# Patient Record
Sex: Male | Born: 1970 | Race: Black or African American | Hispanic: No | Marital: Single | Smoking: Current every day smoker
Health system: Southern US, Community
[De-identification: ages and names within clinical notes are randomized; demographics above are authoritative.]

## PROBLEM LIST (undated history)

## (undated) ENCOUNTER — Emergency Department: Discharge status: Absent without leave

## (undated) HISTORY — PX: LEG SURGERY: SHX1003

---

## 2005-11-12 ENCOUNTER — Emergency Department: Payer: Self-pay | Admitting: Emergency Medicine

## 2005-12-03 ENCOUNTER — Emergency Department: Payer: Self-pay | Admitting: Emergency Medicine

## 2011-02-17 ENCOUNTER — Emergency Department: Payer: Self-pay | Admitting: Emergency Medicine

## 2012-02-12 ENCOUNTER — Emergency Department: Payer: Self-pay | Admitting: Emergency Medicine

## 2012-02-12 LAB — COMPREHENSIVE METABOLIC PANEL
Albumin: 3.5 g/dL (ref 3.4–5.0)
Anion Gap: 11 (ref 7–16)
Bilirubin,Total: 0.3 mg/dL (ref 0.2–1.0)
Calcium, Total: 7.9 mg/dL — ABNORMAL LOW (ref 8.5–10.1)
Creatinine: 1.17 mg/dL (ref 0.60–1.30)
Osmolality: 283 (ref 275–301)
Potassium: 3.1 mmol/L — ABNORMAL LOW (ref 3.5–5.1)
SGOT(AST): 36 U/L (ref 15–37)
Sodium: 142 mmol/L (ref 136–145)
Total Protein: 7.1 g/dL (ref 6.4–8.2)

## 2012-02-12 LAB — DRUG SCREEN, URINE
Barbiturates, Ur Screen: NEGATIVE (ref ?–200)
Benzodiazepine, Ur Scrn: NEGATIVE (ref ?–200)
Cannabinoid 50 Ng, Ur ~~LOC~~: POSITIVE (ref ?–50)
MDMA (Ecstasy)Ur Screen: NEGATIVE (ref ?–500)
Opiate, Ur Screen: NEGATIVE (ref ?–300)
Phencyclidine (PCP) Ur S: NEGATIVE (ref ?–25)
Tricyclic, Ur Screen: NEGATIVE (ref ?–1000)

## 2012-02-12 LAB — ETHANOL: Ethanol: 213 mg/dL

## 2012-02-12 LAB — URINALYSIS, COMPLETE
Blood: NEGATIVE
Glucose,UR: NEGATIVE mg/dL (ref 0–75)
Leukocyte Esterase: NEGATIVE
Protein: NEGATIVE
Squamous Epithelial: NONE SEEN

## 2012-02-12 LAB — TROPONIN I: Troponin-I: <0.02 ng/mL

## 2012-02-12 LAB — CBC
MCH: 30.1 pg (ref 26.0–34.0)
WBC: 4.3 10*3/uL (ref 3.8–10.6)

## 2012-02-13 LAB — ETHANOL: Ethanol: 75 mg/dL

## 2012-05-24 ENCOUNTER — Emergency Department: Payer: Self-pay | Admitting: Emergency Medicine

## 2012-07-11 ENCOUNTER — Emergency Department: Payer: Self-pay | Admitting: Internal Medicine

## 2013-08-19 ENCOUNTER — Emergency Department: Payer: Self-pay | Admitting: Emergency Medicine

## 2013-09-17 ENCOUNTER — Emergency Department: Payer: Self-pay | Admitting: Emergency Medicine

## 2013-10-05 ENCOUNTER — Emergency Department: Payer: Self-pay | Admitting: Emergency Medicine

## 2013-10-05 LAB — RAPID INFLUENZA A&B ANTIGENS

## 2015-02-01 ENCOUNTER — Emergency Department
Admit: 2015-02-01 | Disposition: A | Discharge status: Home / Self Care | Payer: Self-pay | Admitting: Emergency Medicine

## 2016-03-14 ENCOUNTER — Encounter: Payer: Self-pay | Admitting: Emergency Medicine

## 2016-03-14 ENCOUNTER — Emergency Department
Admission: EM | Admit: 2016-03-14 | Discharge: 2016-03-14 | Disposition: A | Discharge status: Home / Self Care | Payer: Self-pay | Attending: Emergency Medicine | Admitting: Emergency Medicine

## 2016-03-14 DIAGNOSIS — W260XXA Contact with knife, initial encounter: Secondary | ICD-10-CM | POA: Insufficient documentation

## 2016-03-14 DIAGNOSIS — Y9389 Activity, other specified: Secondary | ICD-10-CM | POA: Insufficient documentation

## 2016-03-14 DIAGNOSIS — Y9289 Other specified places as the place of occurrence of the external cause: Secondary | ICD-10-CM | POA: Insufficient documentation

## 2016-03-14 DIAGNOSIS — F17210 Nicotine dependence, cigarettes, uncomplicated: Secondary | ICD-10-CM | POA: Insufficient documentation

## 2016-03-14 DIAGNOSIS — Y999 Unspecified external cause status: Secondary | ICD-10-CM | POA: Insufficient documentation

## 2016-03-14 DIAGNOSIS — S61213A Laceration without foreign body of left middle finger without damage to nail, initial encounter: Secondary | ICD-10-CM | POA: Insufficient documentation

## 2016-03-14 DIAGNOSIS — S61215A Laceration without foreign body of left ring finger without damage to nail, initial encounter: Secondary | ICD-10-CM | POA: Insufficient documentation

## 2016-03-14 DIAGNOSIS — S61412A Laceration without foreign body of left hand, initial encounter: Secondary | ICD-10-CM
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
## 2016-03-14 NOTE — Discharge Instructions (Signed)
Laceration Care, Adult A laceration is a cut that goes through all of the layers of the skin and into the tissue that is right under the skin. Some lacerations heal on their own. Others need to be closed with stitches (sutures), staples, skin adhesive strips, or skin glue. Proper laceration care minimizes the risk of infection and helps the laceration to heal better. HOW TO CARE FOR YOUR LACERATION If sutures or staples were used:  Keep the wound clean and dry.  If you were given a bandage (dressing), you should change it at least one time per day or as told by your health care provider. You should also change it if it becomes wet or dirty.  Keep the wound completely dry for the first 24 hours or as told by your health care provider. After that time, you may shower or bathe. However, make sure that the wound is not soaked in water until after the sutures or staples have been removed.  Clean the wound one time each day or as told by your health care provider:  Wash the wound with soap and water.  Rinse the wound with water to remove all soap.  Pat the wound dry with a clean towel. Do not rub the wound.  After cleaning the wound, apply a thin layer of antibiotic ointmentas told by your health care provider. This will help to prevent infection and keep the dressing from sticking to the wound.  Have the sutures or staples removed as told by your health care provider. If skin adhesive strips were used:  Keep the wound clean and dry.  If you were given a bandage (dressing), you should change it at least one time per day or as told by your health care provider. You should also change it if it becomes dirty or wet.  Do not get the skin adhesive strips wet. You may shower or bathe, but be careful to keep the wound dry.  If the wound gets wet, pat it dry with a clean towel. Do not rub the wound.  Skin adhesive strips fall off on their own. You may trim the strips as the wound heals. Do not  remove skin adhesive strips that are still stuck to the wound. They will fall off in time. If skin glue was used:  Try to keep the wound dry, but you may briefly wet it in the shower or bath. Do not soak the wound in water, such as by swimming.  After you have showered or bathed, gently pat the wound dry with a clean towel. Do not rub the wound.  Do not do any activities that will make you sweat heavily until the skin glue has fallen off on its own.  Do not apply liquid, cream, or ointment medicine to the wound while the skin glue is in place. Using those may loosen the film before the wound has healed.  If you were given a bandage (dressing), you should change it at least one time per day or as told by your health care provider. You should also change it if it becomes dirty or wet.  If a dressing is placed over the wound, be careful not to apply tape directly over the skin glue. Doing that may cause the glue to be pulled off before the wound has healed.  Do not pick at the glue. The skin glue usually remains in place for 5-10 days, then it falls off of the skin. General Instructions  Take over-the-counter and prescription  medicines only as told by your health care provider.  If you were prescribed an antibiotic medicine or ointment, take or apply it as told by your doctor. Do not stop using it even if your condition improves.  To help prevent scarring, make sure to cover your wound with sunscreen whenever you are outside after stitches are removed, after adhesive strips are removed, or when glue remains in place and the wound is healed. Make sure to wear a sunscreen of at least 30 SPF.  Do not scratch or pick at the wound.  Keep all follow-up visits as told by your health care provider. This is important.  Check your wound every day for signs of infection. Watch for:  Redness, swelling, or pain.  Fluid, blood, or pus.  Raise (elevate) the injured area above the level of your heart  while you are sitting or lying down, if possible. SEEK MEDICAL CARE IF:  You received a tetanus shot and you have swelling, severe pain, redness, or bleeding at the injection site.  You have a fever.  A wound that was closed breaks open.  You notice a bad smell coming from your wound or your dressing.  You notice something coming out of the wound, such as wood or glass.  Your pain is not controlled with medicine.  You have increased redness, swelling, or pain at the site of your wound.  You have fluid, blood, or pus coming from your wound.  You notice a change in the color of your skin near your wound.  You need to change the dressing frequently due to fluid, blood, or pus draining from the wound.  You develop a new rash.  You develop numbness around the wound. SEEK IMMEDIATE MEDICAL CARE IF:  You develop severe swelling around the wound.  Your pain suddenly increases and is severe.  You develop painful lumps near the wound or on skin that is anywhere on your body.  You have a red streak going away from your wound.  The wound is on your hand or foot and you cannot properly move a finger or toe.  The wound is on your hand or foot and you notice that your fingers or toes look pale or bluish.   This information is not intended to replace advice given to you by your health care provider. Make sure you discuss any questions you have with your health care provider.   Document Released: 09/24/2005 Document Revised: 02/08/2015 Document Reviewed: 09/20/2014 Elsevier Interactive Patient Education 2016 [employer]  Nonsutured Laceration Care A laceration is a cut that goes through all layers of the skin and extends into the tissue that is right under the skin. This type of cut is usually stitched up (sutured) or closed with tape (adhesive strips) or skin glue shortly after the injury happens. However, if the wound is dirty or if several hours pass before medical treatment is  provided, it is likely that germs (bacteria) will enter the wound. Closing a laceration after bacteria have entered it increases the risk of infection. In these cases, your health care provider may leave the laceration open (nonsutured) and cover it with a bandage. This type of treatment helps prevent infection and allows the wound to heal from the deepest layer of tissue damage up to the surface. An open fracture is a type of injury that may involve nonsutured lacerations. An open fracture is a break in a bone that happens along with one or more lacerations through the skin that  is near the fracture site. HOW TO CARE FOR YOUR NONSUTURED LACERATION  Take or apply over-the-counter and prescription medicines only as told by your health care provider.  If you were prescribed an antibiotic medicine, take or apply it as told by your health care provider. Do not stop using the antibiotic even if your condition improves.  Clean the wound one time each day or as told by your health care provider.  Wash the wound with mild soap and water.  Rinse the wound with water to remove all soap.  Pat your wound dry with a clean towel. Do not rub the wound.  Do not inject anything into the wound unless your health care provider told you to.  Change any bandages (dressings) as told by your health care provider. This includes changing the dressing if it gets wet, dirty, or starts to smell bad.  Keep the dressing dry until your health care provider says it can be removed. Do not take baths, swim, or do anything that puts your wound underwater until your health care provider approves.  Raise (elevate) the injured area above the level of your heart while you are sitting or lying down, if possible.  Do not scratch or pick at the wound.  Check your wound every day for signs of infection. Watch for:  Redness, swelling, or pain.  Fluid, blood, or pus.  Keep all follow-up visits as told by your health care  provider. This is important. SEEK MEDICAL CARE IF:  You received a tetanus and shot and you have swelling, severe pain, redness, or bleeding at the injection site.   You have a fever.  Your pain is not controlled with medicine.  You have increased redness, swelling, or pain at the site of your wound.  You have fluid, blood, or pus coming from your wound.  You notice a bad smell coming from your wound or your dressing.  You notice something coming out of the wound, such as wood or glass.  You notice a change in the color of your skin near your wound.  You develop a new rash.  You need to change the dressing frequently due to fluid, blood, or pus draining from the wound.  You develop numbness around your wound. SEEK IMMEDIATE MEDICAL CARE IF:  Your pain suddenly increases and is severe.  You develop severe swelling around the wound.  The wound is on your hand or foot and you cannot properly move a finger or toe.  The wound is on your hand or foot and you notice that your fingers or toes look pale or bluish.  You have a red streak going away from your wound.   This information is not intended to replace advice given to you by your health care provider. Make sure you discuss any questions you have with your health care provider.   Document Released: 08/22/2006 Document Revised: 02/08/2015 Document Reviewed: 09/20/2014 Elsevier Interactive Patient Education [employer].
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
## 2017-04-02 ENCOUNTER — Other Ambulatory Visit
Admission: RE | Admit: 2017-04-02 | Discharge: 2017-04-02 | Disposition: A | Discharge status: Home / Self Care | Attending: Family Medicine | Admitting: Family Medicine

## 2017-05-03 ENCOUNTER — Other Ambulatory Visit
Admission: RE | Admit: 2017-05-03 | Discharge: 2017-05-03 | Disposition: A | Discharge status: Home / Self Care | Attending: Family Medicine | Admitting: Family Medicine
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
## 2017-10-19 ENCOUNTER — Emergency Department: Payer: Self-pay

## 2017-10-19 ENCOUNTER — Emergency Department
Admission: EM | Admit: 2017-10-19 | Discharge: 2017-10-20 | Disposition: A | Discharge status: Home / Self Care | Payer: Self-pay | Attending: Emergency Medicine | Admitting: Emergency Medicine

## 2017-10-19 ENCOUNTER — Encounter: Payer: Self-pay | Admitting: Emergency Medicine

## 2017-10-19 DIAGNOSIS — M542 Cervicalgia: Secondary | ICD-10-CM | POA: Insufficient documentation

## 2017-10-19 DIAGNOSIS — Y939 Activity, unspecified: Secondary | ICD-10-CM | POA: Insufficient documentation

## 2017-10-19 DIAGNOSIS — Y999 Unspecified external cause status: Secondary | ICD-10-CM | POA: Insufficient documentation

## 2017-10-19 DIAGNOSIS — F17210 Nicotine dependence, cigarettes, uncomplicated: Secondary | ICD-10-CM | POA: Insufficient documentation

## 2017-10-19 DIAGNOSIS — F10929 Alcohol use, unspecified with intoxication, unspecified: Secondary | ICD-10-CM | POA: Insufficient documentation

## 2017-10-19 DIAGNOSIS — S0990XA Unspecified injury of head, initial encounter: Secondary | ICD-10-CM | POA: Insufficient documentation

## 2017-10-19 DIAGNOSIS — F1010 Alcohol abuse, uncomplicated: Secondary | ICD-10-CM

## 2017-10-19 DIAGNOSIS — W19XXXA Unspecified fall, initial encounter: Secondary | ICD-10-CM | POA: Insufficient documentation

## 2017-10-19 DIAGNOSIS — Y9289 Other specified places as the place of occurrence of the external cause: Secondary | ICD-10-CM | POA: Insufficient documentation

## 2017-10-19 MED ORDER — HYDROCODONE-ACETAMINOPHEN 5-325 MG PO TABS: 1.0000 | ORAL_TABLET | Freq: Once | ORAL | Status: DC

## 2017-10-19 MED ORDER — IBUPROFEN 600 MG PO TABS
600.0000 mg | ORAL_TABLET | Freq: Once | ORAL | Status: AC
  Administered 2017-10-19: 600 mg via ORAL
  Filled 2017-10-19: qty 1

## 2017-10-19 NOTE — ED Notes (Signed)
Dr. Rifenbark at bedside 

## 2017-10-19 NOTE — ED Provider Notes (Signed)
[facility] Emergency Department Provider Note  ____________________________________________   First MD Initiated Contact with Patient 10/19/17 2257     (approximate)  I have reviewed the triage vital signs and the nursing notes.   HISTORY  Chief Complaint Fall; Head Injury; Neck Pain; and Hearing Problem  Level 5 exemption history limited by the patient's alcohol intoxication  HPI Brian Mcconnell is a 47 y.o. male who was brought to the emergency department by his sister with moderate severity throbbing left neck pain that occurred after a fall at home after drinking alcohol.  The patient has a reported history of frequent falls when he has been drinking.  He does say that he has a throbbing aching discomfort in his left lateral neck.  He denies headache.  He denies double vision or blurred vision.  History reviewed. No pertinent past medical history.  There are no active problems to display for this patient.   Past Surgical History:  Procedure Laterality Date  . LEG SURGERY     bilaterl    Prior to Admission medications   Not on File    Allergies Bee venom  No family history on file.  Social History Social History   Tobacco Use  . Smoking status: Current Every Day Smoker    Packs/day: 1.00    Types: Cigarettes  . Smokeless tobacco: Never Used  Substance Use Topics  . Alcohol use: Yes    Comment: daily  . Drug use: Yes    Types: Cocaine, Marijuana    Review of Systems Level 5 exemption history limited by the patient's alcohol intoxication____________________________________________   PHYSICAL EXAM:  VITAL SIGNS: ED Triage Vitals  Enc Vitals Group     BP 10/19/17 2031 120/71     Pulse Rate 10/19/17 2031 96     Resp 10/19/17 2031 18     Temp 10/19/17 2031 97.9 F (36.6 C)     Temp Source 10/19/17 2031 Oral     SpO2 10/19/17 2031 97 %     Weight 10/19/17 2031 125 lb (56.7 kg)     Height 10/19/17 2031 5\' 3"  (1.6 m)     Head  Circumference --      Peak Flow --      Pain Score 10/19/17 2030 8     Pain Loc --      Pain Edu? --      Excl. in [place]? --     Constitutional: Clearly intoxicated but well-appearing.  No obvious signs of trauma Eyes: PERRL EOMI. Head: Atraumatic.  Normal tympanic membrane on the right mild hemotympanum on the left Nose: No congestion/rhinnorhea. Mouth/Throat: No trismus Neck: No stridor.   Cardiovascular: Normal rate, regular rhythm. Grossly normal heart sounds.  Good peripheral circulation. Respiratory: Normal respiratory effort.  No retractions. Lungs CTAB and moving good air Gastrointestinal: Soft nontender Musculoskeletal: No lower extremity edema   Neurologic:  Normal speech and language. No gross focal neurologic deficits are appreciated. Skin:  Skin is warm, dry and intact. No rash noted. Psychiatric: Mood and affect are normal. Speech and behavior are normal.    ____________________________________________   DIFFERENTIAL includes but not limited to  Intracerebral hemorrhage, cervical spine fracture, basilar skull fracture, intoxication ____________________________________________   LABS (all labs ordered are listed, but only abnormal results are displayed)  Labs Reviewed - No data to display   __________________________________________  EKG   ____________________________________________  RADIOLOGY  CT scan head neck and temporal bone with no acute disease.  I  do appreciate age-indeterminate osteophyte fracture ____________________________________________   PROCEDURES  Procedure(s) performed: no  Procedures  Critical Care performed: no  Observation: no ____________________________________________   INITIAL IMPRESSION / ASSESSMENT AND PLAN / ED COURSE  Pertinent labs & imaging results that were available during my care of the patient were reviewed by me and considered in my medical decision making (see chart for details).  The patient is  behaving appropriately with a normal neuro exam.  CT of his head is negative for acute pathology and CT cervical spine with an age-indeterminate fracture of an osteophyte but no evidence of cord injury.  He does have some slight hemotympanum on the left which is concerning for possible basilar skull fracture.  CT temporal bone is pending.     Fortunately the patient's CT temporal bone is negative for acute pathology.  He does have a ride home.  I have encouraged the patient to stop drinking alcohol and I will provide him resources as an outpatient.  He is discharged home in improved condition verbalizes understanding and agree with plan. ____________________________________________   FINAL CLINICAL IMPRESSION(S) / ED DIAGNOSES  Final diagnoses:  Injury of head, initial encounter  Neck pain  Alcoholic intoxication with complication ([place])  Alcohol abuse      NEW MEDICATIONS STARTED DURING THIS VISIT:  There are no discharge medications for this patient.    Note:  This document was prepared using Dragon voice recognition software and may include unintentional dictation errors.     Darel Hong, MD 10/20/17 [phone]
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
## 2018-07-14 ENCOUNTER — Emergency Department: Payer: Self-pay

## 2018-07-14 ENCOUNTER — Emergency Department
Admission: EM | Admit: 2018-07-14 | Discharge: 2018-07-14 | Disposition: A | Discharge status: Home / Self Care | Payer: Self-pay | Attending: Emergency Medicine | Admitting: Emergency Medicine

## 2018-07-14 ENCOUNTER — Other Ambulatory Visit: Payer: Self-pay

## 2018-07-14 ENCOUNTER — Encounter: Payer: Self-pay | Admitting: *Deleted

## 2018-07-14 DIAGNOSIS — G5631 Lesion of radial nerve, right upper limb: Secondary | ICD-10-CM | POA: Insufficient documentation

## 2018-07-14 DIAGNOSIS — R600 Localized edema: Secondary | ICD-10-CM

## 2018-07-14 DIAGNOSIS — F17210 Nicotine dependence, cigarettes, uncomplicated: Secondary | ICD-10-CM | POA: Insufficient documentation

## 2018-07-14 MED ORDER — MELOXICAM 15 MG PO TABS: 15.0000 mg | ORAL_TABLET | Freq: Every day | ORAL | 0 refills | Status: DC

## 2018-07-14 NOTE — ED Notes (Signed)
Also noted a bump with some blood behind Left ear which occurred at the same time

## 2018-07-14 NOTE — ED Notes (Signed)
Woke Sunday morning with R arm swollen from elbow down. The swelling has gone down some now and pt has been using advil and icy hot. Pt questions if it was maybe an insect bite on Sat night but doesn't recall a specific event or injury. States most painful in inner forearm area

## 2018-07-14 NOTE — ED Triage Notes (Signed)
Pt reports repetitious movement at work with right arm.  Pt has pain and swelling.  No known injury to arm.  Pt alert

## 2018-07-14 NOTE — ED Provider Notes (Signed)
[facility] Emergency Department Provider Note  ____________________________________________  Time seen: Approximately 8:50 PM  I have reviewed the triage vital signs and the nursing notes.   HISTORY  Chief Complaint Arm Injury    HPI Brian Mcconnell is a 47 y.o. male who presents the emergency department complaining of right forearm pain, swelling.  Patient reports that 3 days prior, he fell asleep on his right arm.  Patient reports that he woke up, it was "swollen" painful, has had limited range of motion to the right hand.  Patient reports that when he initially woke up, he was unable to extend or flex the wrist or any digit in his right hand.  Patient reports that he is gradually had return of movement to the wrist and all digits.  Patient initially reported complete numbness, the numbness of the third, fourth, fifth digits, now reports that he has sensation return.  Patient reports continued pain and swelling to the forearm however.  No ecchymosis to the region.  Patient denies any trauma to the area.  He denies any warmth to palpation.  No recent travel or surgeries.  No history of previous DVT, PE or clots.  No bleeding or clotting disorders.  No medications for his complaint prior to arrival.  No other complaints at this time.  Patient denies headache, visual changes, neck pain or stiffness, chest pain, shortness of breath, palpitations, other extremity pain or swelling.    No past medical history on file.  There are no active problems to display for this patient.   Past Surgical History:  Procedure Laterality Date  . LEG SURGERY     bilaterl    Prior to Admission medications   Medication Sig Start Date End Date Taking? Authorizing Provider  meloxicam (MOBIC) 15 MG tablet Take 1 tablet (15 mg total) by mouth daily. 07/14/18   Natali Lavallee, Charline Bills, PA-C    Allergies Bee venom  No family history on file.  Social History Social History   Tobacco Use   . Smoking status: Current Every Day Smoker    Packs/day: 1.00    Types: Cigarettes  . Smokeless tobacco: Never Used  Substance Use Topics  . Alcohol use: Yes    Comment: daily  . Drug use: Yes    Types: Cocaine, Marijuana     Review of Systems  Constitutional: No fever/chills Eyes: No visual changes.  Cardiovascular: no chest pain. Respiratory: no cough. No SOB. Gastrointestinal: No abdominal pain.  No nausea, no vomiting. Musculoskeletal: Positive for pain, swelling to the right forearm. limited range of motion to the wrist and right hand.  Symptoms are improving. Skin: Negative for rash, abrasions, lacerations, ecchymosis. Neurological: Negative for headaches, focal weakness or numbness. 10-point ROS otherwise negative.  ____________________________________________   PHYSICAL EXAM:  VITAL SIGNS: ED Triage Vitals  Enc Vitals Group     BP 07/14/18 1950 109/74     Pulse Rate 07/14/18 1950 80     Resp 07/14/18 1950 18     Temp 07/14/18 1950 99 F (37.2 C)     Temp Source 07/14/18 1950 Oral     SpO2 07/14/18 1950 98 %     Weight 07/14/18 1952 135 lb (61.2 kg)     Height 07/14/18 1952 5\' 2"  (1.575 m)     Head Circumference --      Peak Flow --      Pain Score 07/14/18 1951 0     Pain Loc --  Pain Edu? --      Excl. in [place]? --      Constitutional: Alert and oriented. Well appearing and in no acute distress. Eyes: Conjunctivae are normal. PERRL. EOMI. Head: Atraumatic. Neck: No stridor.  No cervical spine tenderness to palpation.  Cardiovascular: Normal rate, regular rhythm. Normal S1 and S2.  Good peripheral circulation. Respiratory: Normal respiratory effort without tachypnea or retractions. Lungs CTAB. Good air entry to the bases with no decreased or absent breath sounds. Musculoskeletal: Full range of motion to all extremities. No gross deformities appreciated.  Visualization of the right upper extremity reveals moderate edema of the forearm when compared  with unaffected.  No visible erythema.  No visible cellulitic changes.  No visible wounds.  No ecchymosis.  No deformity.  Palpation of the right forearm reveals moderate tenderness with no palpable abnormality.  Compartments are firm but are not hard.  Patient is able to flex the wrist, all digits of the hands appropriately.  No significant pain with flexion or extension of the wrist distal to edema.  Radial pulse intact.  Sensation intact x5 digits.  Capillary refill less than 2 seconds all digits. Neurologic:  Normal speech and language. No gross focal neurologic deficits are appreciated.  Skin:  Skin is warm, dry and intact. No rash noted. Psychiatric: Mood and affect are normal. Speech and behavior are normal. Patient exhibits appropriate insight and judgement.   ____________________________________________   LABS (all labs ordered are listed, but only abnormal results are displayed)  Labs Reviewed - No data to display ____________________________________________  EKG   ____________________________________________  RADIOLOGY I personally viewed and evaluated written report by the radiologist.  US Venous Img Upper Uni Right  Result Date: 07/14/2018 CLINICAL DATA:  Pain and swelling to the right lower arm since yesterday. EXAM: Right UPPER EXTREMITY VENOUS DOPPLER ULTRASOUND TECHNIQUE: Gray-scale sonography with graded compression, as well as color Doppler and duplex ultrasound were performed to evaluate the upper extremity deep venous system from the level of the subclavian vein and including the jugular, axillary, basilic, radial, ulnar and upper cephalic vein. Spectral Doppler was utilized to evaluate flow at rest and with distal augmentation maneuvers. COMPARISON:  None. FINDINGS: Contralateral Subclavian Vein: Respiratory phasicity is normal and symmetric with the symptomatic side. No evidence of thrombus. Normal compressibility. Internal Jugular Vein: No evidence of thrombus. Normal  compressibility, respiratory phasicity and response to augmentation. Subclavian Vein: No evidence of thrombus. Normal compressibility, respiratory phasicity and response to augmentation. Axillary Vein: No evidence of thrombus. Normal compressibility, respiratory phasicity and response to augmentation. Cephalic Vein: No evidence of thrombus. Normal compressibility, respiratory phasicity and response to augmentation. Basilic Vein: No evidence of thrombus. Normal compressibility, respiratory phasicity and response to augmentation. Brachial Veins: No evidence of thrombus. Normal compressibility, respiratory phasicity and response to augmentation. Radial Veins: No evidence of thrombus. Normal compressibility, respiratory phasicity and response to augmentation. Ulnar Veins: No evidence of thrombus. Normal compressibility, respiratory phasicity and response to augmentation. Venous Reflux:  None visualized. Other Findings:  None visualized. IMPRESSION: No evidence of DVT within the right upper extremity. Electronically Signed   By: Lucienne Capers M.D.   On: 07/14/2018 23:05    ____________________________________________    PROCEDURES  Procedure(s) performed:    Procedures    Medications - No data to display   ____________________________________________   INITIAL IMPRESSION / ASSESSMENT AND PLAN / ED COURSE  Pertinent labs & imaging results that were available during my care of the patient were reviewed by me  and considered in my medical decision making (see chart for details).  Review of the [place] CSRS was performed in accordance of the [place] prior to dispensing any controlled drugs.  Clinical Course as of Jul 14 2322  Molli Knock Jul 14, 2018  2112 Patient presents the emergency department with pain, swelling, improving numbness and tingling loss of range of motion to the hand and digits of the right hand.  Patient fell asleep, woke up with symptoms.  On exam, patient does have mild swelling to the  mid forearm.  No skin findings concerning for infection.  Patient symptoms are improving at this time, however pain to the forearm has not fully alleviated.  Patient now has full range of motion to the wrist and all digits right hand.  Compartments are firm from edema, but are not hard.  Differential includes radial nerve injury such as Saturday night palsy, DVT, cellulitis, compartment syndrome.  Patient will have an ultrasound performed to the right upper extremity.  No indication for x-rays at this time.   [JC]    Clinical Course User Index [JC] Lynnex Fulp, Charline Bills, PA-C     Patient's diagnosis is consistent with radial nerve palsy.  Patient presents emergency department complaining of pain, swelling to the forearm with improving return of function to the right hand.  Patient reports that he fell asleep, awoke with pain, swelling to the forearm and inability to flex or extend the wrist or digits of the right hand.  Initially area was numb as well.  This is slowly improving with returning sensation, returning range of motion.  Differential includes radial nerve palsy, cellulitis, compartment syndrome, DVT.  Ultrasound reveals no evidence of DVT.  Compartment is firm from edema but not hard.  No extreme tenderness or pain with distal range of motion.  Based off exam, symptoms, diagnosis is most consistent with radial nerve palsy.  Patient will be placed on meloxicam for improvement.  Follow-up with orthopedics if necessary.  Patient is given ED precautions to return to the ED for any worsening or new symptoms.     ____________________________________________  FINAL CLINICAL IMPRESSION(S) / ED DIAGNOSES  Final diagnoses:  Extremity edema  Radial nerve palsy, right      NEW MEDICATIONS STARTED DURING THIS VISIT:  ED Discharge Orders         Ordered    meloxicam (MOBIC) 15 MG tablet  Daily     07/14/18 2320              This chart was dictated using voice recognition  software/Dragon. Despite best efforts to proofread, errors can occur which can change the meaning. Any change was purely unintentional.    Darletta Moll, PA-C 07/14/18 2323    Harvest Dark, MD 07/14/18 2332

## 2018-07-28 ENCOUNTER — Emergency Department
Admission: EM | Admit: 2018-07-28 | Discharge: 2018-07-28 | Disposition: A | Discharge status: Home / Self Care | Payer: Self-pay | Attending: Student in an Organized Health Care Education/Training Program | Admitting: Student in an Organized Health Care Education/Training Program

## 2018-07-28 ENCOUNTER — Encounter: Payer: Self-pay | Admitting: Emergency Medicine

## 2018-07-28 DIAGNOSIS — F17210 Nicotine dependence, cigarettes, uncomplicated: Secondary | ICD-10-CM | POA: Insufficient documentation

## 2018-07-28 DIAGNOSIS — M62830 Muscle spasm of back: Secondary | ICD-10-CM | POA: Insufficient documentation

## 2018-07-28 MED ORDER — ORPHENADRINE CITRATE 30 MG/ML IJ SOLN: 60.0000 mg | Freq: Two times a day (BID) | INTRAMUSCULAR | Status: DC

## 2018-07-28 MED ORDER — CYCLOBENZAPRINE HCL 5 MG PO TABS: 5.0000 mg | ORAL_TABLET | Freq: Three times a day (TID) | ORAL | 0 refills | Status: DC | PRN

## 2018-07-28 MED ORDER — ORPHENADRINE CITRATE 30 MG/ML IJ SOLN
60.0000 mg | Freq: Once | INTRAMUSCULAR | Status: AC
  Administered 2018-07-28: 60 mg via INTRAMUSCULAR
  Filled 2018-07-28: qty 2

## 2018-07-28 NOTE — ED Notes (Signed)
Lt lower back pain

## 2018-07-28 NOTE — Discharge Instructions (Addendum)
Please continue with meloxicam and take Flexeril as needed for lower back spasms.  If any increasing pain, fevers, swelling, worsening symptoms or urgent changes in your health please return to the emergency department.

## 2018-07-28 NOTE — ED Provider Notes (Signed)
[facility] EMERGENCY DEPARTMENT Provider Note   CSN: [id number] Arrival date & time: 07/28/18  1832     History   Chief Complaint Chief Complaint  Patient presents with  . Back Pain  . Wound Check    HPI Brian Mcconnell is a 47 y.o. male.  Presents to the emergency department for evaluation of recheck of his right arm as well as right lower back muscle spasms.  Patient was seen several weeks ago for evaluation of right arm pain and swelling.  He has been taking meloxicam and seeing improvement in regards to pain and swelling but no warmth redness or fevers.  Patient was diagnosed with a radial nerve palsy, had ultrasound several weeks ago showing no DVT.  No concern for compartment syndrome or abscess.  Patient states overall his symptoms are improving but are not 100% resolved.  He is continuing to take meloxicam.  He denies any numbness tingling or radicular symptoms.  No neck pain.  No warmth redness or fevers.  He is mainly here today for right lower back spasms that occurred today at work.  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  Today, he bent over and felt his right lower back lock up and has been very tight since.  He denies any numbness tingling or radicular symptoms in the lower extremities.  No fevers.  HPI  History reviewed. No pertinent past medical history.  There are no active problems to display for this patient.   Past Surgical History:  Procedure Laterality Date  . LEG SURGERY     bilaterl        Home Medications    Prior to Admission medications   Medication Sig Start Date End Date Taking? Authorizing Provider  cyclobenzaprine (FLEXERIL) 5 MG tablet Take 1-2 tablets (5-10 mg total) by mouth 3 (three) times daily as needed for muscle spasms. 07/28/18   Duanne Guess, PA-C  meloxicam (MOBIC) 15 MG tablet Take 1 tablet (15 mg total) by mouth daily. 07/14/18   Cuthriell, Charline Bills, PA-C     Family History History reviewed. No pertinent family history.  Social History Social History   Tobacco Use  . Smoking status: Current Every Day Smoker    Packs/day: 1.00    Types: Cigarettes  . Smokeless tobacco: Never Used  Substance Use Topics  . Alcohol use: Yes    Comment: daily  . Drug use: Yes    Types: Cocaine, Marijuana     Allergies   Bee venom   Review of Systems Review of Systems  Constitutional: Negative for fever.  Respiratory: Negative for shortness of breath.   Cardiovascular: Negative for chest pain.  Gastrointestinal: Negative for abdominal pain.  Genitourinary: Negative for dysuria and frequency.  Musculoskeletal: Positive for back pain and myalgias. Negative for arthralgias, joint swelling, neck pain and neck stiffness.  Skin: Negative for rash and wound.  Neurological: Negative for dizziness, weakness and headaches.  Psychiatric/Behavioral: The patient is not hyperactive.      Physical Exam Updated Vital Signs BP (!) 109/58 (BP Location: Left Arm)   Pulse 88   Temp 98.3 F (36.8 C) (Oral)   Resp 17   SpO2 98%   Physical Exam  Constitutional: He is oriented to person, place, and time. He appears well-developed and well-nourished.  HENT:  Head: Normocephalic and atraumatic.  Eyes: Conjunctivae are normal.  Neck: Normal range of motion.  Cardiovascular: Normal rate.  Pulmonary/Chest: Effort  normal. No respiratory distress.  Musculoskeletal: Normal range of motion.  Right upper extremity with no swelling warmth or erythema.  No induration or fluctuance.  Compartments of the form are soft.  Full range of motion of the elbow wrist and digits.  No tenderness palpation.  He is neurovascular intact right upper extremity.  Examination of the lumbar spine shows no spinous process tenderness.  Is mildly tender along the right paravertebral muscles of the lumbar spine with no swelling warmth or erythema noted.  He has pain with lumbar flexion, no  pain with extension.  He has limited lumbar flexion.  Full range of motion of both hips with no discomfort.  He is neurovascular intact in bilateral lower extremities.  Ambulatory no antalgic gait.  Neurological: He is alert and oriented to person, place, and time.  Skin: Skin is warm. No rash noted.  Psychiatric: He has a normal mood and affect. His behavior is normal. Thought content normal.     ED Treatments / Results  Labs (all labs ordered are listed, but only abnormal results are displayed) Labs Reviewed - No data to display  EKG None  Radiology No results found.  Procedures Procedures (including critical care time)  Medications Ordered in ED Medications  orphenadrine (NORFLEX) injection 60 mg (has no administration in time range)     Initial Impression / Assessment and Plan / ED Course  I have reviewed the triage vital signs and the nursing notes.  Pertinent labs & imaging results that were available during my care of the patient were reviewed by me and considered in my medical decision making (see chart for details).     47 year old male with nontraumatic lower back pain consistent with lumbar strain.  He is tender along the right paravertebral muscles of the lumbar spine.  He is given IM injection of Norflex along with p.o. Flexeril to take at home.  He will continue with meloxicam in which she is taking for right arm radial nerve palsy which seems to be resolving.  Patient understands signs and symptoms return to the ED for.  Final Clinical Impressions(s) / ED Diagnoses   Final diagnoses:  Lumbar paraspinal muscle spasm    ED Discharge Orders         Ordered    cyclobenzaprine (FLEXERIL) 5 MG tablet  3 times daily PRN     07/28/18 2044           Renata Caprice 07/28/18 2054    Merlyn Lot, MD 07/28/18 2159

## 2018-07-28 NOTE — ED Triage Notes (Signed)
Pt c/o "catch" in lower back since waking this AM as well as an insect bite that has become hard since being seen in ED last week for same. No discharge or bleeding.

## 2018-12-31 ENCOUNTER — Emergency Department
Admission: EM | Admit: 2018-12-31 | Discharge: 2018-12-31 | Disposition: A | Discharge status: Home / Self Care | Payer: No Typology Code available for payment source | Attending: Emergency Medicine | Admitting: Emergency Medicine

## 2018-12-31 ENCOUNTER — Encounter: Payer: Self-pay | Admitting: Emergency Medicine

## 2018-12-31 ENCOUNTER — Other Ambulatory Visit: Payer: Self-pay

## 2018-12-31 DIAGNOSIS — F17210 Nicotine dependence, cigarettes, uncomplicated: Secondary | ICD-10-CM | POA: Diagnosis not present

## 2018-12-31 DIAGNOSIS — Y92410 Unspecified street and highway as the place of occurrence of the external cause: Secondary | ICD-10-CM | POA: Insufficient documentation

## 2018-12-31 DIAGNOSIS — S39012A Strain of muscle, fascia and tendon of lower back, initial encounter: Secondary | ICD-10-CM | POA: Diagnosis not present

## 2018-12-31 DIAGNOSIS — Y9389 Activity, other specified: Secondary | ICD-10-CM | POA: Insufficient documentation

## 2018-12-31 DIAGNOSIS — Y999 Unspecified external cause status: Secondary | ICD-10-CM | POA: Diagnosis not present

## 2018-12-31 DIAGNOSIS — S3992XA Unspecified injury of lower back, initial encounter: Secondary | ICD-10-CM | POA: Diagnosis present

## 2018-12-31 MED ORDER — MELOXICAM 7.5 MG PO TABS: 15.0000 mg | ORAL_TABLET | Freq: Once | ORAL | Status: DC

## 2018-12-31 MED ORDER — METHOCARBAMOL 500 MG PO TABS: 1000.0000 mg | ORAL_TABLET | Freq: Once | ORAL | Status: DC

## 2018-12-31 MED ORDER — KETOROLAC TROMETHAMINE 30 MG/ML IJ SOLN
30.0000 mg | Freq: Once | INTRAMUSCULAR | Status: AC
  Administered 2018-12-31: 30 mg via INTRAMUSCULAR
  Filled 2018-12-31: qty 1

## 2018-12-31 MED ORDER — ORPHENADRINE CITRATE 30 MG/ML IJ SOLN
60.0000 mg | Freq: Once | INTRAMUSCULAR | Status: AC
  Administered 2018-12-31: 60 mg via INTRAMUSCULAR
  Filled 2018-12-31: qty 2

## 2018-12-31 MED ORDER — MELOXICAM 15 MG PO TABS: 15.0000 mg | ORAL_TABLET | Freq: Every day | ORAL | 0 refills | Status: DC

## 2018-12-31 MED ORDER — METHOCARBAMOL 500 MG PO TABS: 500.0000 mg | ORAL_TABLET | Freq: Four times a day (QID) | ORAL | 0 refills | Status: DC
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
## 2021-05-03 ENCOUNTER — Encounter: Payer: Self-pay | Admitting: Intensive Care

## 2021-05-03 ENCOUNTER — Emergency Department: Payer: Medicaid Other

## 2021-05-03 ENCOUNTER — Other Ambulatory Visit: Payer: Self-pay

## 2021-05-03 ENCOUNTER — Emergency Department
Admission: EM | Admit: 2021-05-03 | Discharge: 2021-05-03 | Disposition: A | Discharge status: Home / Self Care | Payer: Medicaid Other | Attending: Emergency Medicine | Admitting: Emergency Medicine

## 2021-05-03 DIAGNOSIS — R0981 Nasal congestion: Secondary | ICD-10-CM | POA: Insufficient documentation

## 2021-05-03 DIAGNOSIS — F17210 Nicotine dependence, cigarettes, uncomplicated: Secondary | ICD-10-CM | POA: Insufficient documentation

## 2021-05-03 DIAGNOSIS — H6693 Otitis media, unspecified, bilateral: Secondary | ICD-10-CM | POA: Insufficient documentation

## 2021-05-03 DIAGNOSIS — J3489 Other specified disorders of nose and nasal sinuses: Secondary | ICD-10-CM | POA: Insufficient documentation

## 2021-05-03 DIAGNOSIS — Z20822 Contact with and (suspected) exposure to covid-19: Secondary | ICD-10-CM | POA: Insufficient documentation

## 2021-05-03 DIAGNOSIS — R059 Cough, unspecified: Secondary | ICD-10-CM | POA: Insufficient documentation

## 2021-05-03 LAB — CBC
HCT: 42.9 % (ref 39.0–52.0)
Hemoglobin: 14.6 g/dL (ref 13.0–17.0)
MCH: 30.4 pg (ref 26.0–34.0)
MCHC: 34 g/dL (ref 30.0–36.0)
MCV: 89.4 fL (ref 80.0–100.0)
Platelets: 179 10*3/uL (ref 150–400)
RBC: 4.8 MIL/uL (ref 4.22–5.81)
RDW: 13.4 % (ref 11.5–15.5)
WBC: 4.3 10*3/uL (ref 4.0–10.5)
nRBC: 0 % (ref 0.0–0.2)

## 2021-05-03 LAB — BASIC METABOLIC PANEL
Anion gap: 11 (ref 5–15)
BUN: 13 mg/dL (ref 6–20)
CO2: 21 mmol/L — ABNORMAL LOW (ref 22–32)
Calcium: 9 mg/dL (ref 8.9–10.3)
Chloride: 106 mmol/L (ref 98–111)
Creatinine, Ser: 1.04 mg/dL (ref 0.61–1.24)
GFR, Estimated: >60 mL/min (ref >60)
Glucose, Bld: 105 mg/dL — ABNORMAL HIGH (ref 70–99)
Potassium: 3.7 mmol/L (ref 3.5–5.1)
Sodium: 138 mmol/L (ref 135–145)

## 2021-05-03 LAB — RESP PANEL BY RT-PCR (FLU A&B, COVID) ARPGX2
Influenza A by PCR: NEGATIVE
Influenza B by PCR: NEGATIVE
SARS Coronavirus 2 by RT PCR: NEGATIVE

## 2021-05-03 LAB — TROPONIN I (HIGH SENSITIVITY): Troponin I (High Sensitivity): 3 ng/L (ref <18)

## 2021-05-03 MED ORDER — ALBUTEROL SULFATE HFA 108 (90 BASE) MCG/ACT IN AERS: 2.0000 | INHALATION_SPRAY | Freq: Four times a day (QID) | RESPIRATORY_TRACT | 2 refills | Status: AC | PRN

## 2021-05-03 MED ORDER — BENZONATATE 100 MG PO CAPS: 100.0000 mg | ORAL_CAPSULE | Freq: Three times a day (TID) | ORAL | 0 refills | Status: AC | PRN

## 2021-05-03 NOTE — ED Triage Notes (Signed)
Patient reports sudden onset of chest tightness and cough around 1pm today. Denies radiation

## 2021-05-03 NOTE — Discharge Instructions (Addendum)
You can take 2 puffs of albuterol when you experience chest tightness with cough. You can take Tessalon Perles for cough. If you experience worsening chest pain or shortness of breath, please return to the emergency department for reevaluation.

## 2021-05-03 NOTE — ED Provider Notes (Signed)
[facility]-EMERGENCY DEPARTMENT  ____________________________________________  Time seen: Approximately 6:13 PM  I have reviewed the triage vital signs and the nursing notes.   HISTORY  Chief Complaint Cough and Chest Pain   Historian Patient     HPI Brian Mcconnell is a 50 y.o. male presents to the emergency department with nasal congestion, rhinorrhea, nonproductive cough and chest tightness that occurred today.  Patient reports that he took an at home COVID-19 test and tested negative.  He reports that his cough has improved some while waiting in the emergency department but coughing spells were really bothering him at home.  Patient states that he has a daily smoker.  He denies current chest tightness, chest pain or shortness of breath.  No fever or chills.  He reports that he had some nausea earlier in the day but no vomiting.  No diarrhea.  No sick contacts in the home with similar symptoms.  No other alleviating measures have been attempted.   History reviewed. No pertinent past medical history.   Immunizations up to date:  Yes.     History reviewed. No pertinent past medical history.  There are no problems to display for this patient.   Past Surgical History:  Procedure Laterality Date   LEG SURGERY     bilaterl    Prior to Admission medications   Medication Sig Start Date End Date Taking? Authorizing Provider  albuterol (VENTOLIN HFA) 108 (90 Base) MCG/ACT inhaler Inhale 2 puffs into the lungs every 6 (six) hours as needed for wheezing or shortness of breath. 05/03/21  Yes Vallarie Mare M, PA-C  benzonatate (TESSALON PERLES) 100 MG capsule Take 1 capsule (100 mg total) by mouth 3 (three) times daily as needed for up to 7 days for cough. 05/03/21 05/10/21 Yes Vallarie Mare M, PA-C  cyclobenzaprine (FLEXERIL) 5 MG tablet Take 1-2 tablets (5-10 mg total) by mouth 3 (three) times daily as needed for muscle spasms. 07/28/18   Duanne Guess, PA-C  meloxicam (MOBIC) 15 MG tablet  Take 1 tablet (15 mg total) by mouth daily. 12/31/18   Cuthriell, Charline Bills, PA-C  methocarbamol (ROBAXIN) 500 MG tablet Take 1 tablet (500 mg total) by mouth 4 (four) times daily. 12/31/18   Cuthriell, Charline Bills, PA-C    Allergies Bee venom  History reviewed. No pertinent family history.  Social History Social History   Tobacco Use   Smoking status: Every Day    Packs/day: 0.50    Types: Cigarettes   Smokeless tobacco: Never  Substance Use Topics   Alcohol use: Yes    Alcohol/week: 24.0 standard drinks    Types: 24 Cans of beer per week   Drug use: Yes    Types: Cocaine, Marijuana     Review of Systems  Constitutional: No fever/chills Eyes:  No discharge ENT: Patient has nasal congestion. Respiratory: Patient has cough. Gastrointestinal:   No nausea, no vomiting.  No diarrhea.  No constipation. Musculoskeletal: Negative for musculoskeletal pain. Skin: Negative for rash, abrasions, lacerations, ecchymosis.    ____________________________________________   PHYSICAL EXAM:  VITAL SIGNS: ED Triage Vitals  Enc Vitals Group     BP 05/03/21 1518 123/86     Pulse Rate 05/03/21 1518 77     Resp 05/03/21 1518 18     Temp 05/03/21 1518 98 F (36.7 C)     Temp Source 05/03/21 1518 Oral     SpO2 05/03/21 1518 99 %     Weight 05/03/21 1519 130 lb (59 kg)  Height 05/03/21 1519 '5\' 2"'$  (1.575 m)     Head Circumference --      Peak Flow --      Pain Score 05/03/21 1519 7     Pain Loc --      Pain Edu? --      Excl. in [place]? --      Constitutional: Alert and oriented. Patient is lying supine. Eyes: Conjunctivae are normal. PERRL. EOMI. Head: Atraumatic. ENT:      Ears: Tympanic membranes are mildly injected with mild effusion bilaterally.       Nose: No congestion/rhinnorhea.      Mouth/Throat: Mucous membranes are moist. Posterior pharynx is mildly erythematous.  Hematological/Lymphatic/Immunilogical: No cervical lymphadenopathy.  Cardiovascular: Normal rate,  regular rhythm. Normal S1 and S2.  Good peripheral circulation. Respiratory: Normal respiratory effort without tachypnea or retractions. Lungs CTAB. Good air entry to the bases with no decreased or absent breath sounds. Gastrointestinal: Bowel sounds 4 quadrants. Soft and nontender to palpation. No guarding or rigidity. No palpable masses. No distention. No CVA tenderness. Musculoskeletal: Full range of motion to all extremities. No gross deformities appreciated. Neurologic:  Normal speech and language. No gross focal neurologic deficits are appreciated.  Skin:  Skin is warm, dry and intact. No rash noted. Psychiatric: Mood and affect are normal. Speech and behavior are normal. Patient exhibits appropriate insight and judgement.   ____________________________________________   LABS (all labs ordered are listed, but only abnormal results are displayed)  Labs Reviewed  BASIC METABOLIC PANEL - Abnormal; Notable for the following components:      Result Value   CO2 21 (*)    Glucose, Bld 105 (*)    All other components within normal limits  RESP PANEL BY RT-PCR (FLU A&B, COVID) ARPGX2  CBC  TROPONIN I (HIGH SENSITIVITY)  TROPONIN I (HIGH SENSITIVITY)   ____________________________________________  EKG   ____________________________________________  RADIOLOGY Unk Pinto, personally viewed and evaluated these images (plain radiographs) as part of my medical decision making, as well as reviewing the written report by the radiologist.  DG Chest 2 View  Result Date: 05/03/2021 CLINICAL DATA:  Chest tightness EXAM: CHEST - 2 VIEW COMPARISON:  09/25/2013 FINDINGS: The heart size and mediastinal contours are within normal limits. No focal airspace consolidation, pleural effusion, or pneumothorax. The visualized skeletal structures are unremarkable. IMPRESSION: No active cardiopulmonary disease. Electronically Signed   By: Davina Poke D.O.   On: 05/03/2021 16:18     ____________________________________________    PROCEDURES  Procedure(s) performed:     Procedures     Medications - No data to display   ____________________________________________   INITIAL IMPRESSION / ASSESSMENT AND PLAN / ED COURSE  Pertinent labs & imaging results that were available during my care of the patient were reviewed by me and considered in my medical decision making (see chart for details).      Assessment and Plan:  Cough  Nasal congestion  Rhinorrhea  50 year old male presents to the emergency department with cough, nasal congestion, rhinorrhea and chest tightness.  Vital signs are reassuring at triage.  On physical exam, patient was resting comfortably with no increased work of breathing.  He had no adventitious lung sounds on exam.  He denies any chest pain, chest tightness or shortness of breath stating that has cough and improved.  He did have some cough with exhalation.  Will perform COVID-19 and influenza testing and send patient home with albuterol inhaler and Tessalon Perles for cough.  Return  precautions were given to return to the emergency department with worsening chest pain or shortness of breath.  CBC, BMP and troponin were within reference range.  EKG indicated normal sinus rhythm without ST segment elevation or other apparent arrhythmia.      ____________________________________________  FINAL CLINICAL IMPRESSION(S) / ED DIAGNOSES  Final diagnoses:  Cough  Nasal congestion  Rhinorrhea      NEW MEDICATIONS STARTED DURING THIS VISIT:  ED Discharge Orders          Ordered    benzonatate (TESSALON PERLES) 100 MG capsule  3 times daily PRN        05/03/21 1808    albuterol (VENTOLIN HFA) 108 (90 Base) MCG/ACT inhaler  Every 6 hours PRN        05/03/21 1808                This chart was dictated using voice recognition software/Dragon. Despite best efforts to proofread, errors can occur which can change the  meaning. Any change was purely unintentional.     Lannie Fields, PA-C 05/03/21 1823    Blake Divine, MD 05/04/21 [phone]

## 2022-02-13 IMAGING — CR DG CHEST 2V
2 series · 2 of 2 positions shown · non-contrast
Comparison: 09/25/2013

CLINICAL DATA: Chest tightness

EXAM:
CHEST - 2 VIEW

[chest pa]
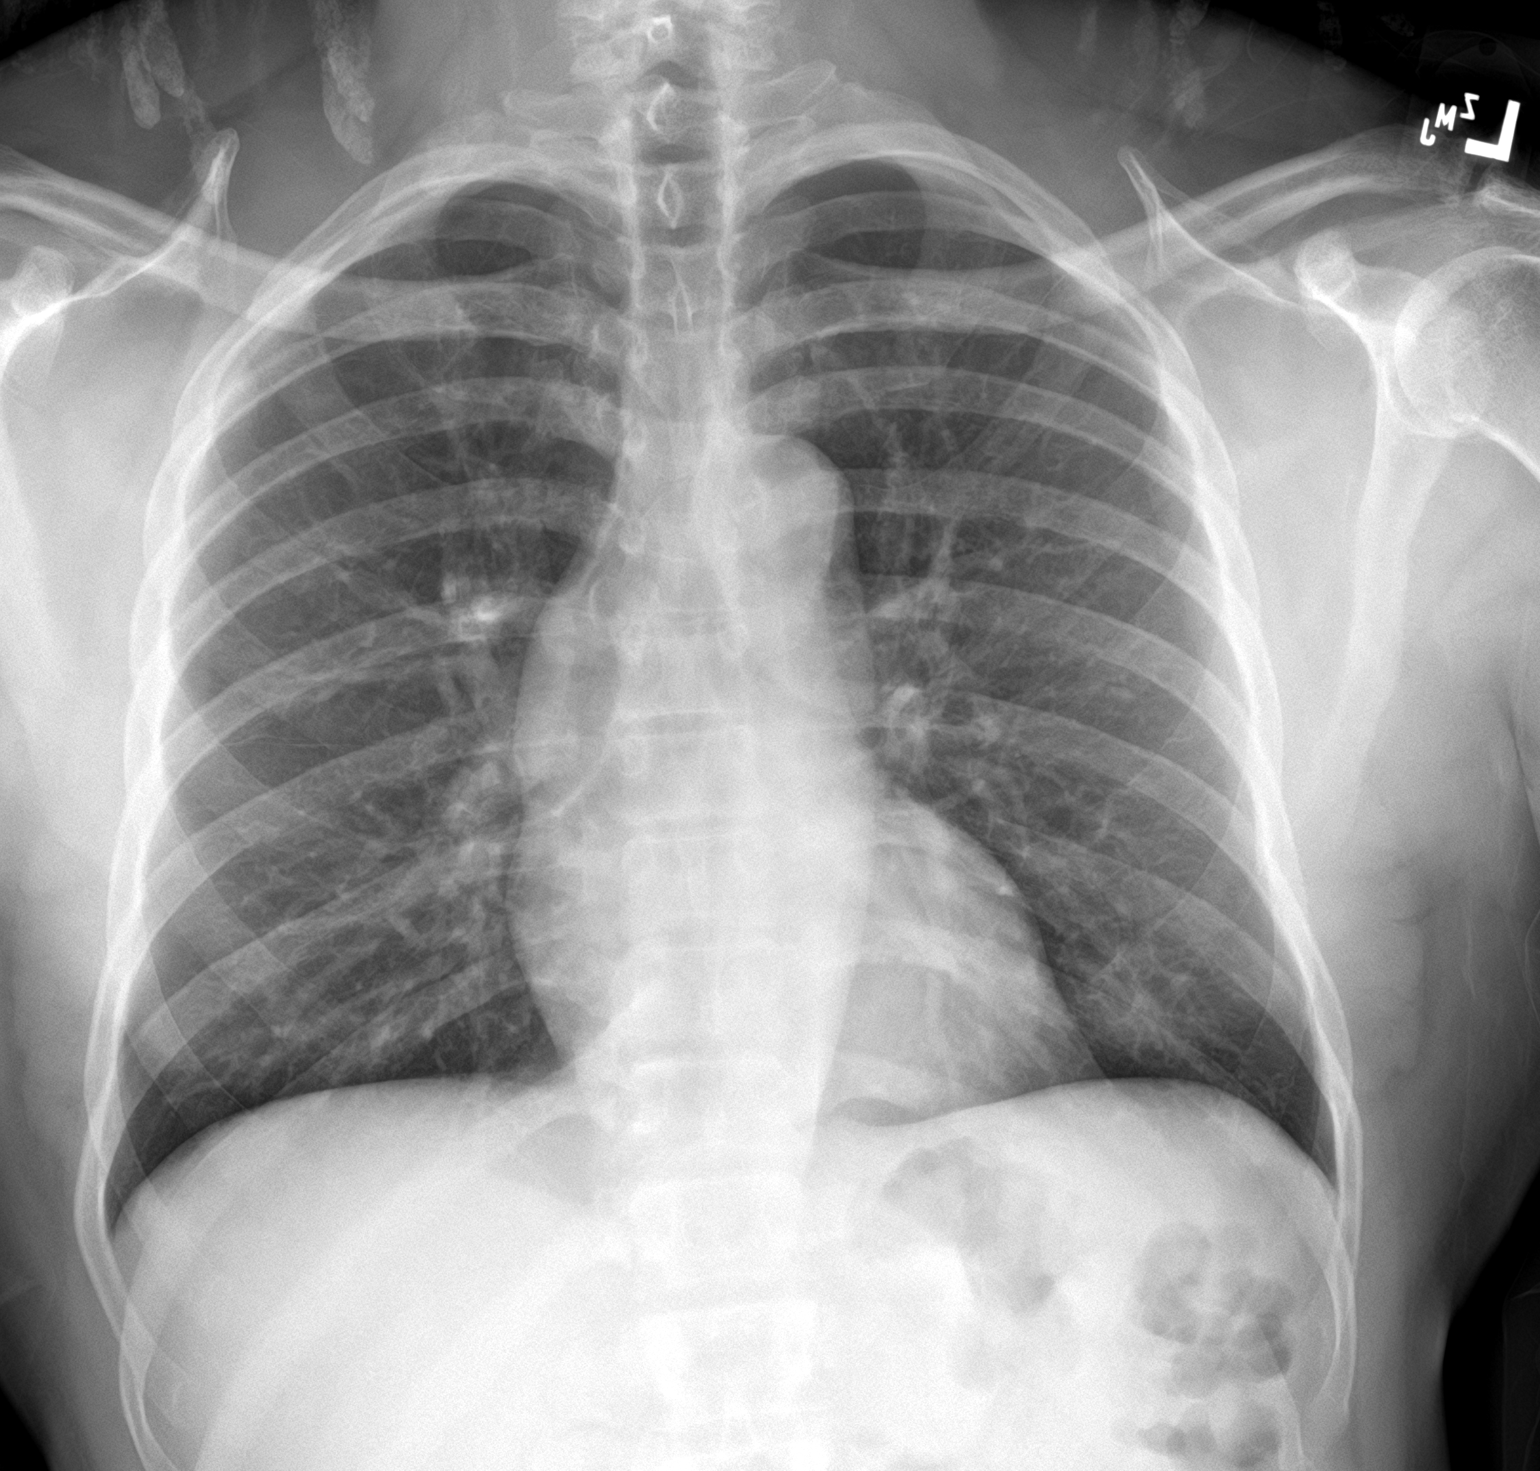

[chest lat]
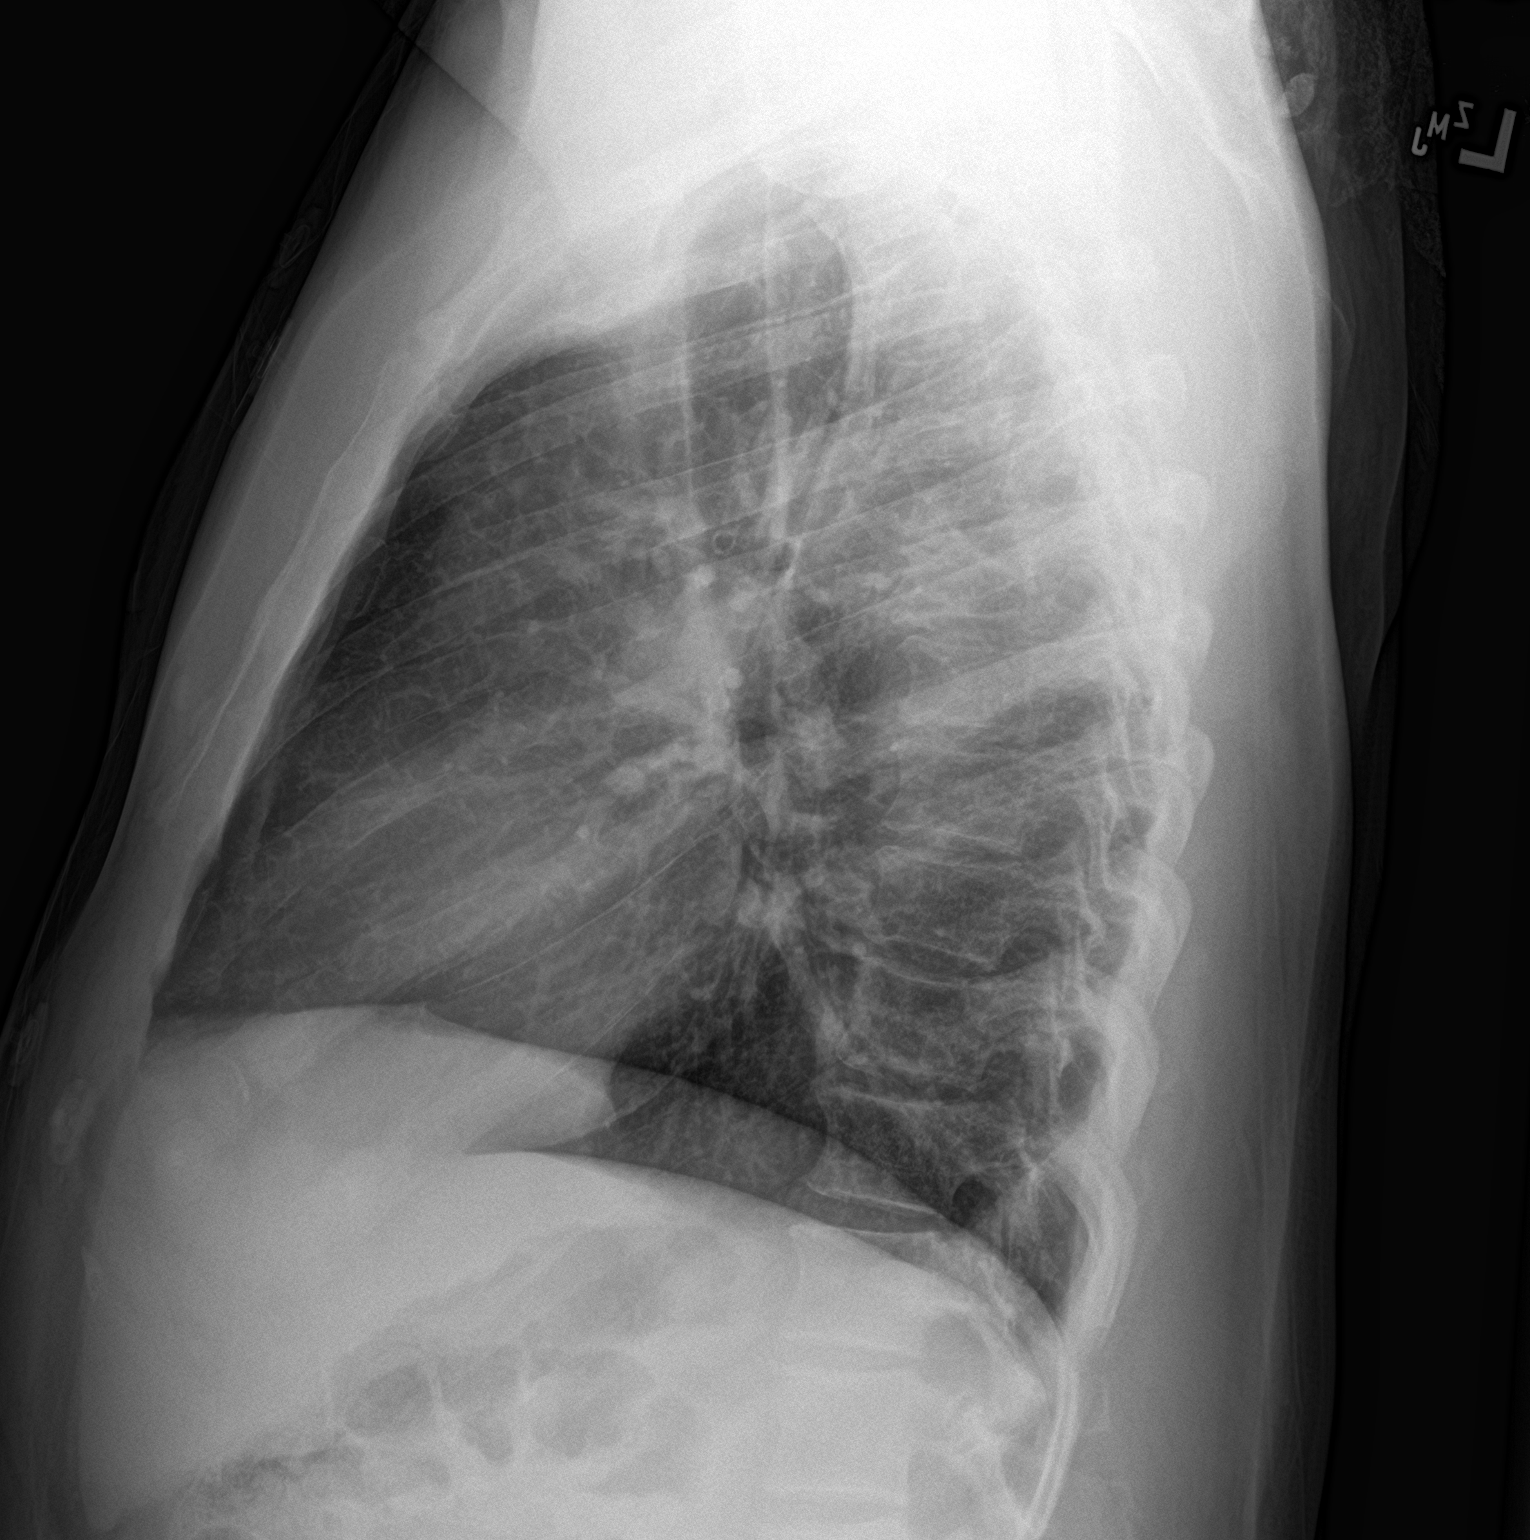

[2 of 2 positions shown; findings below may reference images not displayed]

FINDINGS: The heart size and mediastinal contours are within normal limits. No
focal airspace consolidation, pleural effusion, or pneumothorax. The
visualized skeletal structures are unremarkable.
IMPRESSION: No active cardiopulmonary disease.

## 2022-07-09 ENCOUNTER — Other Ambulatory Visit: Payer: Self-pay

## 2022-07-09 ENCOUNTER — Emergency Department: Payer: Medicaid Other

## 2022-07-09 ENCOUNTER — Emergency Department
Admission: EM | Admit: 2022-07-09 | Discharge: 2022-07-09 | Disposition: A | Discharge status: Home / Self Care | Payer: Medicaid Other | Attending: Emergency Medicine | Admitting: Emergency Medicine

## 2022-07-09 DIAGNOSIS — R7401 Elevation of levels of liver transaminase levels: Secondary | ICD-10-CM | POA: Insufficient documentation

## 2022-07-09 DIAGNOSIS — R1033 Periumbilical pain: Secondary | ICD-10-CM

## 2022-07-09 LAB — COMPREHENSIVE METABOLIC PANEL
ALT: 37 U/L (ref 0–44)
AST: 50 U/L — ABNORMAL HIGH (ref 15–41)
Albumin: 4.7 g/dL (ref 3.5–5.0)
Alkaline Phosphatase: 54 U/L (ref 38–126)
Anion gap: 8 (ref 5–15)
BUN: 10 mg/dL (ref 6–20)
CO2: 25 mmol/L (ref 22–32)
Calcium: 9.5 mg/dL (ref 8.9–10.3)
Chloride: 106 mmol/L (ref 98–111)
Creatinine, Ser: 0.95 mg/dL (ref 0.61–1.24)
GFR, Estimated: >60 mL/min (ref >60)
Glucose, Bld: 98 mg/dL (ref 70–99)
Potassium: 4.1 mmol/L (ref 3.5–5.1)
Sodium: 139 mmol/L (ref 135–145)
Total Bilirubin: 0.8 mg/dL (ref 0.3–1.2)
Total Protein: 8.5 g/dL — ABNORMAL HIGH (ref 6.5–8.1)

## 2022-07-09 LAB — URINALYSIS, ROUTINE W REFLEX MICROSCOPIC
Bilirubin Urine: NEGATIVE
Glucose, UA: NEGATIVE mg/dL
Hgb urine dipstick: NEGATIVE
Ketones, ur: NEGATIVE mg/dL
Leukocytes,Ua: NEGATIVE
Nitrite: NEGATIVE
Protein, ur: NEGATIVE mg/dL
Specific Gravity, Urine: 1.016 (ref 1.005–1.030)
pH: 5 (ref 5.0–8.0)

## 2022-07-09 LAB — CBC
HCT: 47.2 % (ref 39.0–52.0)
Hemoglobin: 15.6 g/dL (ref 13.0–17.0)
MCH: 29.2 pg (ref 26.0–34.0)
MCHC: 33.1 g/dL (ref 30.0–36.0)
MCV: 88.2 fL (ref 80.0–100.0)
Platelets: 202 10*3/uL (ref 150–400)
RBC: 5.35 MIL/uL (ref 4.22–5.81)
RDW: 13.3 % (ref 11.5–15.5)
WBC: 4.2 10*3/uL (ref 4.0–10.5)
nRBC: 0 % (ref 0.0–0.2)

## 2022-07-09 LAB — LIPASE, BLOOD: Lipase: 26 U/L (ref 11–51)

## 2022-07-09 MED ORDER — ONDANSETRON HCL 4 MG/2ML IJ SOLN
4.0000 mg | Freq: Once | INTRAMUSCULAR | Status: AC
  Administered 2022-07-09: 4 mg via INTRAVENOUS
  Filled 2022-07-09: qty 2

## 2022-07-09 MED ORDER — IOHEXOL 300 MG/ML  SOLN
100.0000 mL | Freq: Once | INTRAMUSCULAR | Status: AC | PRN
  Administered 2022-07-09: 100 mL via INTRAVENOUS

## 2022-07-09 MED ORDER — OMEPRAZOLE 10 MG PO CPDR: 10.0000 mg | DELAYED_RELEASE_CAPSULE | Freq: Every day | ORAL | 6 refills | Status: DC

## 2022-07-09 MED ORDER — LIDOCAINE VISCOUS HCL 2 % MT SOLN: 15.0000 mL | Freq: Three times a day (TID) | OROMUCOSAL | 1 refills | Status: AC | PRN

## 2022-07-09 MED ORDER — FAMOTIDINE 20 MG PO TABS: 20.0000 mg | ORAL_TABLET | Freq: Every day | ORAL | 0 refills | Status: AC

## 2022-07-09 MED ORDER — DICYCLOMINE HCL 10 MG PO CAPS: 10.0000 mg | ORAL_CAPSULE | Freq: Three times a day (TID) | ORAL | 0 refills | Status: AC | PRN

## 2022-07-09 NOTE — ED Provider Notes (Signed)
[facility] Provider Note  Patient Contact: 4:21 PM (approximate)   History   No chief complaint on file.   HPI  Brian Mcconnell is a 51 y.o. male who presents the emergency department complaining of central abdominal pain but no vomiting, diarrhea or constipation.  No urinary changes.  Patient states that he is eating less than normal because of his symptoms.  He also endorses occasionally gagging and coughing up some blood when he is brushing his teeth but this has not brought him in today.  No fevers, chills.  Patient still has all of his abdominal organs.  No chronic GI issues.  Patient also reports heavy alcohol use which he has stopped 2 days ago.  No DT symptoms at this time.     Physical Exam   Triage Vital Signs: ED Triage Vitals  Enc Vitals Group     BP 07/09/22 1441 (!) 133/103     Pulse Rate 07/09/22 1441 93     Resp 07/09/22 1441 18     Temp 07/09/22 1441 98.5 F (36.9 C)     Temp src --      SpO2 07/09/22 1441 97 %     Weight 07/09/22 1442 132 lb (59.9 kg)     Height 07/09/22 1442 '5\' 2"'$  (1.575 m)     Head Circumference --      Peak Flow --      Pain Score --      Pain Loc --      Pain Edu? --      Excl. in [place]? --     Most recent vital signs: Vitals:   07/09/22 1441  BP: (!) 133/103  Pulse: 93  Resp: 18  Temp: 98.5 F (36.9 C)  SpO2: 97%     General: Alert and in no acute distress. ENT:      Ears:       Nose: No congestion/rhinnorhea.      Mouth/Throat: Mucous membranes are moist. Neck: No stridor. No cervical spine tenderness to palpation.  Cardiovascular:  Good peripheral perfusion Respiratory: Normal respiratory effort without tachypnea or retractions. Lungs CTAB. Good air entry to the bases with no decreased or absent breath sounds. Gastrointestinal: Bowel sounds 4 quadrants.  Soft to palpation all quadrants.  Tenderness in the periumbilical region.  No guarding or rigidity. No palpable masses. No distention. No CVA  tenderness. Musculoskeletal: Full range of motion to all extremities.  Neurologic:  No gross focal neurologic deficits are appreciated.  Skin:   No rash noted Other:   ED Results / Procedures / Treatments   Labs (all labs ordered are listed, but only abnormal results are displayed) Labs Reviewed  COMPREHENSIVE METABOLIC PANEL - Abnormal; Notable for the following components:      Result Value   Total Protein 8.5 (*)    AST 50 (*)    All other components within normal limits  URINALYSIS, ROUTINE W REFLEX MICROSCOPIC - Abnormal; Notable for the following components:   Color, Urine YELLOW (*)    APPearance CLEAR (*)    All other components within normal limits  LIPASE, BLOOD  CBC     EKG     RADIOLOGY  I personally viewed, evaluated, and interpreted these images as part of my medical decision making, as well as reviewing the written report by the radiologist.  ED Provider Interpretation: CT is reassuring with no evidence of colitis, cholecystitis, appendicitis, small bowel obstruction, incarcerated hernia  CT Abdomen Pelvis W  Contrast  Result Date: 07/09/2022 CLINICAL DATA:  Abdominal pain EXAM: CT ABDOMEN AND PELVIS WITH CONTRAST TECHNIQUE: Multidetector CT imaging of the abdomen and pelvis was performed using the standard protocol following bolus administration of intravenous contrast. RADIATION DOSE REDUCTION: This exam was performed according to the departmental dose-optimization program which includes automated exposure control, adjustment of the mA and/or kV according to patient size and/or use of iterative reconstruction technique. CONTRAST:  119m OMNIPAQUE IOHEXOL 300 MG/ML  SOLN COMPARISON:  None FINDINGS: Lower chest: Heart is enlarged in size. Breathing motion limits evaluation of lower lung fields. There is no focal consolidation in the lower lung fields. Hepatobiliary: There is fatty infiltration in liver. There is no dilation of bile ducts. Gallbladder is not  distended. Pancreas: No focal abnormalities are seen. Spleen: Unremarkable. Adrenals/Urinary Tract: Adrenals are not enlarged. There is no hydronephrosis. There are no renal or ureteral stones. Urinary bladder is unremarkable. Stomach/Bowel: Stomach is unremarkable. Small bowel loops are not dilated. Appendix is not dilated. Tip of appendix is slightly to the left of midline. There is no significant wall thickening in colon. Vascular/Lymphatic: Vascular structures are unremarkable. Subcentimeter lymph nodes are seen in retroperitoneum Reproductive: Unremarkable. Other: There is no evidence of pneumoperitoneum or ascites. Umbilical hernia containing fat is seen. Musculoskeletal: There is previous internal fixation in both femurs. Degenerative changes are noted in lumbar spine with encroachment of neural foramina at L4-L5 and L5-S1 levels. IMPRESSION: There is no evidence of intestinal obstruction or pneumoperitoneum. Appendix is not dilated. There is no hydronephrosis. Fatty liver.  Lumbar spondylosis. Other findings as described in the body of the report. Electronically Signed   By: PElmer PickerM.D.   On: 07/09/2022 18:10    PROCEDURES:  Critical Care performed: No  Procedures   MEDICATIONS ORDERED IN ED: Medications  ondansetron (ZOFRAN) injection 4 mg (4 mg Intravenous Given 07/09/22 1708)  iohexol (OMNIPAQUE) 300 MG/ML solution 100 mL (100 mLs Intravenous Contrast Given 07/09/22 1732)     IMPRESSION / MDM / ASSESSMENT AND PLAN / ED COURSE  I reviewed the triage vital signs and the nursing notes.                              Differential diagnosis includes, but is not limited to, colitis, appendicitis, cholecystitis, hernia, IBS, Crohn's, gastroenteritis  Patient's presentation is most consistent with acute presentation with potential threat to life or bodily function.   Patient's diagnosis is consistent with nonspecific abdominal pain.  Patient presented to the emergency department  with periumbilical abdominal pain for the last 2 weeks.  Has no vomiting, diarrhea, constipation.  No urinary changes.  Patient's labs are reassuring.  Patient does have a slight elevation in LFTs but is a heavy alcohol user and has just out 2 days ago.  Lipase, CBC, remainder of CMP is reassuring.  At this time given the tenderness with ongoing symptoms x2 weeks patient had imaging with CT scan.  CT revealed fat-containing hernia with no evidence of bowel loops or incarceration.  No evidence of small bowel obstruction.  No evidence of colitis, appendicitis or cholecystitis.  Patient will be prescribed some controlled medication of omeprazole, famotidine, GI cocktail and Bentyl.  Follow-up with GI if symptoms persist.  Return precautions discussed with the patient..  Patient is given ED precautions to return to the ED for any worsening or new symptoms.    Clinical Course as of 07/09/22 1912  Mon Jul 09, 2022  1717 Color, Urine(!): YELLOW [JC]    Clinical Course User Index [JC] Xaviera Flaten, Charline Bills, PA-C     FINAL CLINICAL IMPRESSION(S) / ED DIAGNOSES   Final diagnoses:  Periumbilical abdominal pain     Rx / DC Orders   ED Discharge Orders          Ordered    omeprazole (PRILOSEC) 10 MG capsule  Daily        07/09/22 1911    famotidine (PEPCID) 20 MG tablet  Daily        07/09/22 1911    GI Cocktail (alum & mag hydroxide-simethicone/lidocaine)oral mixture  3 times daily PRN        07/09/22 1911    dicyclomine (BENTYL) 10 MG capsule  3 times daily PRN        07/09/22 1911             Note:  This document was prepared using Dragon voice recognition software and may include unintentional dictation errors.   Brynda Peon 07/09/22 1912    Vanessa Sussex, MD 07/09/22 2350

## 2022-08-21 ENCOUNTER — Encounter: Payer: Self-pay | Admitting: Gastroenterology

## 2022-08-21 ENCOUNTER — Ambulatory Visit (INDEPENDENT_AMBULATORY_CARE_PROVIDER_SITE_OTHER): Payer: Self-pay | Admitting: Gastroenterology

## 2022-08-21 ENCOUNTER — Other Ambulatory Visit: Payer: Self-pay

## 2022-08-21 VITALS — BP 141/93 | HR 86 | Temp 98.7°F | Ht 62.5 in | Wt 136.4 lb

## 2022-08-21 DIAGNOSIS — R1084 Generalized abdominal pain: Secondary | ICD-10-CM

## 2022-08-21 DIAGNOSIS — Z1211 Encounter for screening for malignant neoplasm of colon: Secondary | ICD-10-CM

## 2022-08-21 DIAGNOSIS — F1010 Alcohol abuse, uncomplicated: Secondary | ICD-10-CM

## 2022-08-21 DIAGNOSIS — Z791 Long term (current) use of non-steroidal anti-inflammatories (NSAID): Secondary | ICD-10-CM

## 2022-08-21 DIAGNOSIS — F1290 Cannabis use, unspecified, uncomplicated: Secondary | ICD-10-CM

## 2022-08-21 MED ORDER — NA SULFATE-K SULFATE-MG SULF 17.5-3.13-1.6 GM/177ML PO SOLN: 354.0000 mL | Freq: Once | ORAL | 0 refills | Status: DC

## 2022-08-21 MED ORDER — OMEPRAZOLE 40 MG PO CPDR: 40.0000 mg | DELAYED_RELEASE_CAPSULE | Freq: Two times a day (BID) | ORAL | 0 refills | Status: DC

## 2022-08-21 NOTE — Patient Instructions (Addendum)
Please start taking your Omeprazole 40 MG twice a day. Please take it 30 minutes before breakfast and 30 minutes before dinner time for the next 3 months and then stop. You will also pick up your Suprep solution to clean up before your colonoscopy.  Food Choices for Gastroesophageal Reflux Disease, Adult When you have gastroesophageal reflux disease (GERD), the foods you eat and your eating habits are very important. Choosing the right foods can help ease your discomfort. Think about working with a food expert (dietitian) to help you make good choices. What are tips for following this plan? Reading food labels Look for foods that are low in saturated fat. Foods that may help with your symptoms include: Foods that have less than 5% of daily value (DV) of fat. Foods that have 0 grams of trans fat. Cooking Do not fry your food. Cook your food by baking, steaming, grilling, or broiling. These are all methods that do not need a lot of fat for cooking. To add flavor, try to use herbs that are low in spice and acidity. Meal planning  Choose healthy foods that are low in fat, such as: Fruits and vegetables. Whole grains. Low-fat dairy products. Lean meats, fish, and poultry. Eat small meals often instead of eating 3 large meals each day. Eat your meals slowly in a place where you are relaxed. Avoid bending over or lying down until 2-3 hours after eating. Limit high-fat foods such as fatty meats or fried foods. Limit your intake of fatty foods, such as oils, butter, and shortening. Avoid the following as told by your doctor: Foods that cause symptoms. These may be different for different people. Keep a food diary to keep track of foods that cause symptoms. Alcohol. Drinking a lot of liquid with meals. Eating meals during the 2-3 hours before bed. Lifestyle Stay at a healthy weight. Ask your doctor what weight is healthy for you. If you need to lose weight, work with your doctor to do so  safely. Exercise for at least 30 minutes on 5 or more days each week, or as told by your doctor. Wear loose-fitting clothes. Do not smoke or use any products that contain nicotine or tobacco. If you need help quitting, ask your doctor. Sleep with the head of your bed higher than your feet. Use a wedge under the mattress or blocks under the bed frame to raise the head of the bed. Chew sugar-free gum after meals. What foods should eat?  Eat a healthy, well-balanced diet of fruits, vegetables, whole grains, low-fat dairy products, lean meats, fish, and poultry. Each person is different. Foods that may cause symptoms in one person may not cause any symptoms in another person. Work with your doctor to find foods that are safe for you. The items listed above may not be a complete list of what you can eat and drink. Contact a food expert for more options. What foods should I avoid? Limiting some of these foods may help in managing the symptoms of GERD. Everyone is different. Talk with a food expert or your doctor to help you find the exact foods to avoid, if any. Fruits Any fruits prepared with added fat. Any fruits that cause symptoms. For some people, this may include citrus fruits, such as oranges, grapefruit, pineapple, and lemons. Vegetables Deep-fried vegetables. [place] fries. Any vegetables prepared with added fat. Any vegetables that cause symptoms. For some people, this may include tomatoes and tomato products, chili peppers, onions and garlic, and horseradish. Grains  Pastries or quick breads with added fat. Meats and other proteins High-fat meats, such as fatty beef or pork, hot dogs, ribs, ham, sausage, salami, and bacon. Fried meat or protein, including fried fish and fried chicken. Nuts and nut butters, in large amounts. Dairy Whole milk and chocolate milk. Sour cream. Cream. Ice cream. Cream cheese. Milkshakes. Fats and oils Butter. Margarine. Shortening. Ghee. Beverages Coffee and  tea, with or without caffeine. Carbonated beverages. Sodas. Energy drinks. Fruit juice made with acidic fruits, such as orange or grapefruit. Tomato juice. Alcoholic drinks. Sweets and desserts Chocolate and cocoa. Donuts. Seasonings and condiments Pepper. Peppermint and spearmint. Added salt. Any condiments, herbs, or seasonings that cause symptoms. For some people, this may include curry, hot sauce, or vinegar-based salad dressings. The items listed above may not be a complete list of what you should not eat and drink. Contact a food expert for more options. Questions to ask your doctor Diet and lifestyle changes are often the first steps that are taken to manage symptoms of GERD. If diet and lifestyle changes do not help, talk with your doctor about taking medicines. Where to find more information International Foundation for Gastrointestinal Disorders: aboutgerd.org Summary When you have GERD, food and lifestyle choices are very important in easing your symptoms. Eat small meals often instead of 3 large meals a day. Eat your meals slowly and in a place where you are relaxed. Avoid bending over or lying down until 2-3 hours after eating. Limit high-fat foods such as fatty meats or fried foods. This information is not intended to replace advice given to you by your health care provider. Make sure you discuss any questions you have with your health care provider. Document Revised: 04/04/2020 Document Reviewed: 04/04/2020 Elsevier Patient Education  [place].

## 2022-08-21 NOTE — Progress Notes (Signed)
Jonathon Bellows MD, MRCP(U.K) [address]  [place]  [place], [place] [address]  Main: [phone]  Fax: [phone]   Gastroenterology Consultation  Referring Provider:     [facility] [place] Physician:  [facility] [place] Primary Gastroenterologist:  Dr. Jonathon Bellows  Reason for Consultation:    Abdominal pain        HPI:   Brian Mcconnell is a 51 y.o. y/o male referred with seen in the emergency rule on 07/09/2022 when he presented with central abdominal pain with background history of excess alcohol consumption that he had just stopped he underwent a CT scan of the abdomen that showed no abnormalities except fatty liver, lipase, CMP was normal except mildly elevated AST at 50 and CBC was normal with a hemoglobin of 15.6 he was given a GI cocktail and advised to follow-up with GI.  He is here today to see me for the abdominal pain ongoing for a few months not improved present throughout the day sharp in nature points to his umbilicus no clear aggravating or relieving factors except a hot shower.  Uses a pack of Advil a day for a few months, consumes alcohol on a daily basis for many years at least 3 beers a day.  Exposure to marijuana.  No unintentional weight loss no prior colonoscopy no prior endoscopy not on a PPI.   No past medical history on file.  Past Surgical History:  Procedure Laterality Date   LEG SURGERY     bilaterl    Prior to Admission medications   Medication Sig Start Date End Date Taking? Authorizing Provider  albuterol (VENTOLIN HFA) 108 (90 Base) MCG/ACT inhaler Inhale 2 puffs into the lungs every 6 (six) hours as needed for wheezing or shortness of breath. 05/03/21   Lannie Fields, PA-C  dicyclomine (BENTYL) 10 MG capsule Take 1 capsule (10 mg total) by mouth 3 (three) times daily as needed (abdominal cramping). 07/09/22 08/08/22  Cuthriell, Charline Bills, PA-C  famotidine (PEPCID) 20 MG tablet Take 1 tablet (20 mg total) by mouth  daily. 07/09/22 07/09/23  Cuthriell, Charline Bills, PA-C  GI Cocktail (alum & mag hydroxide-simethicone/lidocaine)oral mixture Take 15 mLs by mouth 3 (three) times daily as needed (Abd pain). 07/09/22   Cuthriell, Charline Bills, PA-C  omeprazole (PRILOSEC) 10 MG capsule Take 1 capsule (10 mg total) by mouth daily. 07/09/22   Cuthriell, Charline Bills, PA-C    No family history on file.   Social History   Tobacco Use   Smoking status: Every Day    Packs/day: 0.50    Types: Cigarettes   Smokeless tobacco: Never  Substance Use Topics   Alcohol use: Yes    Alcohol/week: 24.0 standard drinks of alcohol    Types: 24 Cans of beer per week   Drug use: Yes    Types: Cocaine, Marijuana    Allergies as of 08/21/2022 - Review Complete 08/21/2022  Allergen Reaction Noted   Bee venom Hives and Swelling 10/19/2017    Review of Systems:    All systems reviewed and negative except where noted in HPI.   Physical Exam:  BP (!) 141/93   Pulse 86   Temp 98.7 F (37.1 C) (Oral)   Ht 5' 2.5" (1.588 m)   Wt 136 lb 6.4 oz (61.9 kg)   BMI 24.55 kg/m  No LMP for male patient. Psych:  Alert and cooperative. Normal mood and affect. General:   Alert,  Well-developed, well-nourished, pleasant  and cooperative in NAD Head:  Normocephalic and atraumatic. Eyes:  Sclera clear, no icterus.   Conjunctiva pink. Ears:  Normal auditory acuity. Neck:  Supple; no masses or thyromegaly. Abdomen:  Normal bowel sounds.  No bruits.  Soft, non-tender and non-distended without masses, hepatosplenomegaly or hernias noted.  No guarding or rebound tenderness.    Neurologic:  Alert and oriented x3;  grossly normal neurologically. Psych:  Alert and cooperative. Normal mood and affect.  Imaging Studies: No results found.  Assessment and Plan:   Brian Mcconnell is a 51 y.o. y/o male has been referred for periumbilical pain ongoing for a few months.  CT scan showed no abnormality except fatty liver history of excess alcohol consumption,  excess use of NSAIDs, excess use of marijuana.  He does get relief with a hot shower.  Never had a screening colonoscopy.  Abdominal pain likely due to a combination of effects of long-term use of NSAIDs possibly causing a peptic ulcer, side effects of the use of marijuana as well as excess, alcohol possibly causing a gastritis  Plan 1.Stop all alcohol , nsaid and marijuana use 2. H pylori breath test  3. Trial of PPI omeprazole 40 mg BID for 3 months 4. EGD+ colonoscopy in 6 + weeks , no soonner  I have discussed alternative options, risks & benefits,  which include, but are not limited to, bleeding, infection, perforation,respiratory complication & drug reaction.  The patient agrees with this plan & written consent will be obtained.     Follow up in 3 months  Dr Jonathon Bellows MD,MRCP(U.K)

## 2022-08-22 ENCOUNTER — Telehealth: Payer: Self-pay | Admitting: Gastroenterology

## 2022-08-22 ENCOUNTER — Other Ambulatory Visit: Payer: Self-pay

## 2022-08-22 MED ORDER — OMEPRAZOLE 40 MG PO CPDR
40.0000 mg | DELAYED_RELEASE_CAPSULE | Freq: Two times a day (BID) | ORAL | 0 refills | Status: AC
  Filled 2022-08-22: qty 60, 30d supply, fill #0

## 2022-08-22 MED ORDER — NA SULFATE-K SULFATE-MG SULF 17.5-3.13-1.6 GM/177ML PO SOLN
354.0000 mL | Freq: Once | ORAL | 0 refills | Status: DC
  Filled 2022-08-22: qty 354, fill #0

## 2022-08-22 MED ORDER — PEG 3350-KCL-NA BICARB-NACL 420 G PO SOLR
ORAL | 0 refills | Status: AC
  Filled 2022-08-22: qty 4000, 1d supply, fill #0

## 2022-08-22 NOTE — Telephone Encounter (Signed)
Called patient back and he did not answer. I left his a detailed message to call me back if he still had further questions about his Omeprazole.

## 2022-08-22 NOTE — Telephone Encounter (Signed)
Pt would like a call back in ref to reduced cost of medication call back number is [phone]

## 2022-08-22 NOTE — Addendum Note (Signed)
Addended by: Wayna Chalet on: 08/22/2022 09:08 AM   Modules accepted: Orders

## 2022-08-22 NOTE — Addendum Note (Signed)
Addended by: Wayna Chalet on: 08/22/2022 09:42 AM   Modules accepted: Orders

## 2022-08-23 ENCOUNTER — Other Ambulatory Visit: Payer: Self-pay

## 2022-08-23 LAB — H. PYLORI BREATH TEST: H pylori Breath Test: NEGATIVE

## 2022-08-23 NOTE — Telephone Encounter (Signed)
Called patient but his phone is not currently working. If patient calls, he needs to go to [facility] outpatient pharmacy to pick up his prescriptions (GoLytely and Omeprazole). Patient has zero co-pay but needs to fill out a form for the pharmacy.

## 2022-08-24 ENCOUNTER — Other Ambulatory Visit: Payer: Self-pay

## 2022-08-31 ENCOUNTER — Other Ambulatory Visit: Payer: Self-pay

## 2022-10-04 ENCOUNTER — Telehealth: Payer: Self-pay | Admitting: Gastroenterology

## 2022-10-04 NOTE — Telephone Encounter (Signed)
Patient has questions about colonoscopy tomorrow. Requesting a call back.

## 2022-10-04 NOTE — Telephone Encounter (Signed)
Called patient and spoke to patient's wife. I was able to explain to her how he is to take his medication.

## 2022-10-05 ENCOUNTER — Encounter
Admission: RE | Disposition: A | Discharge status: Home / Self Care | Payer: Self-pay | Source: Home / Self Care | Attending: Gastroenterology

## 2022-10-05 ENCOUNTER — Ambulatory Visit
Admission: RE | Admit: 2022-10-05 | Discharge: 2022-10-05 | Disposition: A | Discharge status: Home / Self Care | Payer: Medicaid Other | Attending: Gastroenterology | Admitting: Gastroenterology

## 2022-10-05 ENCOUNTER — Ambulatory Visit: Payer: Medicaid Other | Admitting: Anesthesiology

## 2022-10-05 DIAGNOSIS — K3189 Other diseases of stomach and duodenum: Secondary | ICD-10-CM | POA: Insufficient documentation

## 2022-10-05 DIAGNOSIS — D126 Benign neoplasm of colon, unspecified: Secondary | ICD-10-CM

## 2022-10-05 DIAGNOSIS — D124 Benign neoplasm of descending colon: Secondary | ICD-10-CM | POA: Diagnosis not present

## 2022-10-05 DIAGNOSIS — D122 Benign neoplasm of ascending colon: Secondary | ICD-10-CM | POA: Diagnosis not present

## 2022-10-05 DIAGNOSIS — R109 Unspecified abdominal pain: Secondary | ICD-10-CM | POA: Insufficient documentation

## 2022-10-05 DIAGNOSIS — F17210 Nicotine dependence, cigarettes, uncomplicated: Secondary | ICD-10-CM | POA: Insufficient documentation

## 2022-10-05 DIAGNOSIS — D128 Benign neoplasm of rectum: Secondary | ICD-10-CM | POA: Insufficient documentation

## 2022-10-05 DIAGNOSIS — Z1211 Encounter for screening for malignant neoplasm of colon: Secondary | ICD-10-CM | POA: Diagnosis present

## 2022-10-05 DIAGNOSIS — R1084 Generalized abdominal pain: Secondary | ICD-10-CM

## 2022-10-05 DIAGNOSIS — F1010 Alcohol abuse, uncomplicated: Secondary | ICD-10-CM

## 2022-10-05 DIAGNOSIS — Z791 Long term (current) use of non-steroidal anti-inflammatories (NSAID): Secondary | ICD-10-CM

## 2022-10-05 DIAGNOSIS — F1290 Cannabis use, unspecified, uncomplicated: Secondary | ICD-10-CM

## 2022-10-05 HISTORY — PX: COLONOSCOPY WITH PROPOFOL: SHX5780

## 2022-10-05 HISTORY — PX: ESOPHAGOGASTRODUODENOSCOPY: SHX5428

## 2022-10-05 SURGERY — COLONOSCOPY WITH PROPOFOL
Anesthesia: General

## 2022-10-05 MED ORDER — SODIUM CHLORIDE 0.9 % IV SOLN
INTRAVENOUS | Status: DC
  Administered 2022-10-05: 20 mL/h via INTRAVENOUS

## 2022-10-05 MED ORDER — DEXMEDETOMIDINE HCL IN NACL 200 MCG/50ML IV SOLN
INTRAVENOUS | Status: DC | PRN
  Administered 2022-10-05: 12 ug via INTRAVENOUS

## 2022-10-05 MED ORDER — PROPOFOL 500 MG/50ML IV EMUL
INTRAVENOUS | Status: DC | PRN
  Administered 2022-10-05: 140 ug/kg/min via INTRAVENOUS

## 2022-10-05 MED ORDER — LIDOCAINE HCL (CARDIAC) PF 100 MG/5ML IV SOSY
PREFILLED_SYRINGE | INTRAVENOUS | Status: DC | PRN
  Administered 2022-10-05: 50 mg via INTRAVENOUS

## 2022-10-05 MED ORDER — STERILE WATER FOR IRRIGATION IR SOLN
Status: DC | PRN
  Administered 2022-10-05: 60 mL

## 2022-10-05 MED ORDER — PROPOFOL 10 MG/ML IV BOLUS
INTRAVENOUS | Status: DC | PRN
  Administered 2022-10-05: 30 mg via INTRAVENOUS
  Administered 2022-10-05: 100 mg via INTRAVENOUS
  Administered 2022-10-05 (×2): 20 mg via INTRAVENOUS

## 2022-10-05 NOTE — Transfer of Care (Signed)
Immediate Anesthesia Transfer of Care Note  Patient: Brian Mcconnell  Procedure(s) Performed: COLONOSCOPY WITH PROPOFOL ESOPHAGOGASTRODUODENOSCOPY (EGD)  Patient Location: PACU and Endoscopy Unit  Anesthesia Type:General  Level of Consciousness: drowsy and patient cooperative  Airway & Oxygen Therapy: Patient Spontanous Breathing  Post-op Assessment: Report given to RN and Post -op Vital signs reviewed and stable  Post vital signs: Reviewed and stable  Last Vitals:  Vitals Value Taken Time  BP 87/64 10/05/22 0948  Temp 36.2 C 10/05/22 0948  Pulse 92 10/05/22 0950  Resp 27 10/05/22 0950  SpO2 99 % 10/05/22 0950  Vitals shown include unvalidated device data.  Last Pain:  Vitals:   10/05/22 0948  TempSrc: Temporal  PainSc: Asleep         Complications: No notable events documented.

## 2022-10-05 NOTE — H&P (Signed)
Jonathon Bellows, MD [address] [place], [place], [place], [address] [address], [place], [place], [place], [address] Phone: [phone]  Fax: [phone]  Primary Care Physician:  [facility]   Pre-Procedure History & Physical: HPI:  Brian Mcconnell is a 51 y.o. male is here for an endoscopy and colonoscopy    No past medical history on file.  Past Surgical History:  Procedure Laterality Date   LEG SURGERY     bilaterl    Prior to Admission medications   Medication Sig Start Date End Date Taking? Authorizing Provider  omeprazole (PRILOSEC) 40 MG capsule Take 1 capsule (40 mg total) by mouth in the morning and at bedtime. 08/22/22  Yes Jonathon Bellows, MD  polyethylene glycol-electrolytes (NULYTELY) 420 g solution Prepare according to package instructions. Starting at 5:00 PM: Drink one 8 oz glass of mixture every 15 minutes until you finish half of the jug. Five hours prior to procedure, drink 8 oz glass of mixture every 15 minutes until it is all gone. Make sure you do not drink anything 4 hours prior to your procedure. 08/22/22  Yes Jonathon Bellows, MD  albuterol (VENTOLIN HFA) 108 (90 Base) MCG/ACT inhaler Inhale 2 puffs into the lungs every 6 (six) hours as needed for wheezing or shortness of breath. Patient not taking: Reported on 08/21/2022 05/03/21   Lannie Fields, PA-C  dicyclomine (BENTYL) 10 MG capsule Take 1 capsule (10 mg total) by mouth 3 (three) times daily as needed (abdominal cramping). Patient not taking: Reported on 08/21/2022 07/09/22 08/08/22  Cuthriell, Charline Bills, PA-C  famotidine (PEPCID) 20 MG tablet Take 1 tablet (20 mg total) by mouth daily. Patient not taking: Reported on 08/21/2022 07/09/22 07/09/23  Cuthriell, Charline Bills, PA-C  GI Cocktail (alum & mag hydroxide-simethicone/lidocaine)oral mixture Take 15 mLs by mouth 3 (three) times daily as needed (Abd pain). Patient not taking: Reported on 08/21/2022 07/09/22   Cuthriell, Charline Bills, PA-C     Allergies as of 08/21/2022 - Review Complete 08/21/2022  Allergen Reaction Noted   Bee venom Hives and Swelling 10/19/2017    No family history on file.  Social History   Socioeconomic History   Marital status: Single    Spouse name: Not on file   Number of children: Not on file   Years of education: Not on file   Highest education level: Not on file  Occupational History   Not on file  Tobacco Use   Smoking status: Every Day    Packs/day: 0.50    Types: Cigarettes   Smokeless tobacco: Never  Substance and Sexual Activity   Alcohol use: Yes    Alcohol/week: 24.0 standard drinks of alcohol    Types: 24 Cans of beer per week   Drug use: Yes    Types: Cocaine, Marijuana   Sexual activity: Yes  Other Topics Concern   Not on file  Social History Narrative   Not on file   Social Determinants of Health   Financial Resource Strain: Not on file  Food Insecurity: Not on file  Transportation Needs: Not on file  Physical Activity: Not on file  Stress: Not on file  Social Connections: Not on file  Intimate Partner Violence: Not on file    Review of Systems: See HPI, otherwise negative ROS  Physical Exam: BP (!) 122/92   Pulse 94   Temp (!) 96.4 F (35.8 C) (Temporal)   Resp 20   Ht '5\' 2"'$  (1.575 m)  Wt 60.8 kg   SpO2 100%   BMI 24.51 kg/m  General:   Alert,  pleasant and cooperative in NAD Head:  Normocephalic and atraumatic. Neck:  Supple; no masses or thyromegaly. Lungs:  Clear throughout to auscultation, normal respiratory effort.    Heart:  +S1, +S2, Regular rate and rhythm, No edema. Abdomen:  Soft, nontender and nondistended. Normal bowel sounds, without guarding, and without rebound.   Neurologic:  Alert and  oriented x4;  grossly normal neurologically.  Impression/Plan: Brian Mcconnell is here for an endoscopy and colonoscopy  to be performed for  evaluation of abdominal pain and colon cancer screening     Risks, benefits, limitations, and  alternatives regarding endoscopy have been reviewed with the patient.  Questions have been answered.  All parties agreeable.   Jonathon Bellows, MD  10/05/2022, 9:04 AM

## 2022-10-05 NOTE — Op Note (Signed)
[facility] Gastroenterology Patient Name: Brian Mcconnell Procedure Date: 10/05/2022 9:08 AM MRN: [id number] Account #: [id number] Date of Birth: 1970/12/15 Admit Type: Outpatient Age: 51 Room: [facility] ENDO ROOM 4 Gender: Male Note Status: Finalized Instrument Name: Park Meo [id number] Procedure:             Colonoscopy Indications:           Screening for colorectal malignant neoplasm Providers:             Jonathon Bellows MD, MD Referring MD:          No Local Md, MD (Referring MD) Medicines:             Monitored Anesthesia Care Complications:         No immediate complications. Procedure:             Pre-Anesthesia Assessment:                        - Prior to the procedure, a History and Physical was                         performed, and patient medications, allergies and                         sensitivities were reviewed. The patient's tolerance                         of previous anesthesia was reviewed.                        - The risks and benefits of the procedure and the                         sedation options and risks were discussed with the                         patient. All questions were answered and informed                         consent was obtained.                        - ASA Grade Assessment: II - A patient with mild                         systemic disease.                        After obtaining informed consent, the colonoscope was                         passed under direct vision. Throughout the procedure,                         the patient's blood pressure, pulse, and oxygen                         saturations were monitored continuously. The                         Colonoscope was  introduced through the anus and                         advanced to the the cecum, identified by the                         appendiceal orifice. The colonoscopy was performed                         with ease. The patient tolerated the procedure well.                          The quality of the bowel preparation was excellent.                         The ileocecal valve, appendiceal orifice, and rectum                         were photographed. Findings:      The perianal and digital rectal examinations were normal.      Three sessile polyps were found in the rectum, descending colon and       ascending colon. The polyps were 5 to 6 mm in size. These polyps were       removed with a cold snare. Resection and retrieval were complete.      The exam was otherwise without abnormality on direct and retroflexion       views. Impression:            - Three 5 to 6 mm polyps in the rectum, in the                         descending colon and in the ascending colon, removed                         with a cold snare. Resected and retrieved.                        - The examination was otherwise normal on direct and                         retroflexion views. Recommendation:        - Discharge patient to home (with escort).                        - Resume previous diet.                        - Continue present medications.                        - Await pathology results.                        - Repeat colonoscopy for surveillance based on                         pathology results. Procedure Code(s):     --- Professional ---  45385, Colonoscopy, flexible; with removal of                         tumor(s), polyp(s), or other lesion(s) by snare                         technique Diagnosis Code(s):     --- Professional ---                        Z12.11, Encounter for screening for malignant neoplasm                         of colon                        D12.8, Benign neoplasm of rectum                        D12.4, Benign neoplasm of descending colon                        D12.2, Benign neoplasm of ascending colon CPT copyright 2022 American Medical Association. All rights reserved. The codes documented in this report are preliminary  and upon coder review may  be revised to meet current compliance requirements. Jonathon Bellows, MD Jonathon Bellows MD, MD 10/05/2022 9:46:36 AM This report has been signed electronically. Number of Addenda: 0 Note Initiated On: 10/05/2022 9:08 AM Scope Withdrawal Time: 0 hours 9 minutes 25 seconds  Total Procedure Duration: 0 hours 11 minutes 16 seconds  Estimated Blood Loss:  Estimated blood loss: none.      [facility]

## 2022-10-05 NOTE — Op Note (Signed)
[facility] Gastroenterology Patient Name: Brian Mcconnell Procedure Date: 10/05/2022 9:09 AM MRN: [id number] Account #: [id number] Date of Birth: 02/28/1971 Admit Type: Outpatient Age: 51 Room: [facility] ENDO ROOM 4 Gender: Male Note Status: Finalized Instrument Name: Upper Endoscope [id number] Procedure:             Upper GI endoscopy Indications:           Abdominal pain Providers:             Jonathon Bellows MD, MD Referring MD:          No Local Md, MD (Referring MD) Medicines:             Monitored Anesthesia Care Complications:         No immediate complications. Procedure:             Pre-Anesthesia Assessment:                        - Prior to the procedure, a History and Physical was                         performed, and patient medications, allergies and                         sensitivities were reviewed. The patient's tolerance                         of previous anesthesia was reviewed.                        - The risks and benefits of the procedure and the                         sedation options and risks were discussed with the                         patient. All questions were answered and informed                         consent was obtained.                        - ASA Grade Assessment: II - A patient with mild                         systemic disease.                        After obtaining informed consent, the endoscope was                         passed under direct vision. Throughout the procedure,                         the patient's blood pressure, pulse, and oxygen                         saturations were monitored continuously. The Endoscope                         was  introduced through the mouth, and advanced to the                         third part of duodenum. The upper GI endoscopy was                         accomplished with ease. The patient tolerated the                         procedure well. Findings:      The esophagus was normal.       The examined duodenum was normal.      Multiple localized medium erosions with no stigmata of recent bleeding       were found on the greater curvature of the stomach. Biopsies were taken       with a cold forceps for histology.      The cardia and gastric fundus were normal on retroflexion. Impression:            - Normal esophagus.                        - Normal examined duodenum.                        - Erosive gastropathy with no stigmata of recent                         bleeding. Biopsied. Recommendation:        - Await pathology results.                        - Perform a colonoscopy today. Procedure Code(s):     --- Professional ---                        [phone], Esophagogastroduodenoscopy, flexible,                         transoral; with biopsy, single or multiple Diagnosis Code(s):     --- Professional ---                        K31.89, Other diseases of stomach and duodenum                        R10.9, Unspecified abdominal pain CPT copyright 2022 American Medical Association. All rights reserved. The codes documented in this report are preliminary and upon coder review may  be revised to meet current compliance requirements. Jonathon Bellows, MD Jonathon Bellows MD, MD 10/05/2022 9:33:06 AM This report has been signed electronically. Number of Addenda: 0 Note Initiated On: 10/05/2022 9:09 AM Estimated Blood Loss:  Estimated blood loss: none.      [facility]

## 2022-10-05 NOTE — Anesthesia Postprocedure Evaluation (Signed)
Anesthesia Post Note  Patient: Designer, industrial/product  Procedure(s) Performed: COLONOSCOPY WITH PROPOFOL ESOPHAGOGASTRODUODENOSCOPY (EGD)  Patient location during evaluation: PACU Anesthesia Type: General Level of consciousness: awake and awake and alert Pain management: satisfactory to patient Respiratory status: spontaneous breathing and respiratory function stable Cardiovascular status: stable Anesthetic complications: no  No notable events documented.   Last Vitals:  Vitals:   10/05/22 0959 10/05/22 1011  BP: 91/73 105/84  Pulse: 85 80  Resp: (!) 22 (!) 22  Temp:    SpO2: 100% 99%    Last Pain:  Vitals:   10/05/22 1011  TempSrc:   PainSc: 0-No pain                 VAN STAVEREN,Jeaneen Cala

## 2022-10-05 NOTE — Anesthesia Preprocedure Evaluation (Signed)
Anesthesia Evaluation  Patient identified by MRN, date of birth, ID band Patient awake    Reviewed: Allergy & Precautions, NPO status , Patient's Chart, lab work & pertinent test results  Airway Mallampati: III  TM Distance: >3 FB Neck ROM: Full    Dental  (+) Teeth Intact   Pulmonary neg pulmonary ROS, Current Smoker and Patient abstained from smoking.   Pulmonary exam normal breath sounds clear to auscultation       Cardiovascular Exercise Tolerance: Good negative cardio ROS Normal cardiovascular exam Rhythm:Regular     Neuro/Psych negative neurological ROS  negative psych ROS   GI/Hepatic negative GI ROS, Neg liver ROS,,,(+)     substance abuse  cocaine use  Endo/Other  negative endocrine ROS    Renal/GU negative Renal ROS  negative genitourinary   Musculoskeletal negative musculoskeletal ROS (+)    Abdominal Normal abdominal exam  (+)   Peds negative pediatric ROS (+)  Hematology negative hematology ROS (+)   Anesthesia Other Findings No past medical history on file.  Past Surgical History: No date: LEG SURGERY     Comment:  bilaterl  BMI    Body Mass Index: 24.51 kg/m      Reproductive/Obstetrics negative OB ROS                             Anesthesia Physical Anesthesia Plan  ASA: 2  Anesthesia Plan: General   Post-op Pain Management:    Induction: Intravenous  PONV Risk Score and Plan: Propofol infusion and TIVA  Airway Management Planned: Natural Airway  Additional Equipment:   Intra-op Plan:   Post-operative Plan:   Informed Consent: I have reviewed the patients History and Physical, chart, labs and discussed the procedure including the risks, benefits and alternatives for the proposed anesthesia with the patient or authorized representative who has indicated his/her understanding and acceptance.     Dental Advisory Given  Plan Discussed with: CRNA  and Surgeon  Anesthesia Plan Comments:        Anesthesia Quick Evaluation

## 2022-10-06 ENCOUNTER — Encounter: Payer: Self-pay | Admitting: Gastroenterology

## 2022-10-09 LAB — SURGICAL PATHOLOGY

## 2022-10-22 ENCOUNTER — Other Ambulatory Visit: Payer: Self-pay

## 2022-10-22 ENCOUNTER — Emergency Department: Payer: Medicaid Other

## 2022-10-22 ENCOUNTER — Emergency Department
Admission: EM | Admit: 2022-10-22 | Discharge: 2022-10-22 | Disposition: A | Discharge status: Home / Self Care | Payer: Medicaid Other | Attending: Emergency Medicine | Admitting: Emergency Medicine

## 2022-10-22 DIAGNOSIS — S0990XA Unspecified injury of head, initial encounter: Secondary | ICD-10-CM | POA: Diagnosis not present

## 2022-10-22 DIAGNOSIS — M25551 Pain in right hip: Secondary | ICD-10-CM | POA: Insufficient documentation

## 2022-10-22 DIAGNOSIS — W108XXA Fall (on) (from) other stairs and steps, initial encounter: Secondary | ICD-10-CM | POA: Diagnosis not present

## 2022-10-22 DIAGNOSIS — W19XXXA Unspecified fall, initial encounter: Secondary | ICD-10-CM

## 2022-10-22 DIAGNOSIS — M25552 Pain in left hip: Secondary | ICD-10-CM | POA: Insufficient documentation

## 2022-10-22 DIAGNOSIS — M542 Cervicalgia: Secondary | ICD-10-CM | POA: Diagnosis not present

## 2022-10-22 DIAGNOSIS — M545 Low back pain, unspecified: Secondary | ICD-10-CM | POA: Diagnosis not present

## 2022-10-22 DIAGNOSIS — R531 Weakness: Secondary | ICD-10-CM | POA: Diagnosis not present

## 2022-10-22 DIAGNOSIS — T07XXXA Unspecified multiple injuries, initial encounter: Secondary | ICD-10-CM

## 2022-10-22 MED ORDER — TRAMADOL HCL 50 MG PO TABS
50.0000 mg | ORAL_TABLET | Freq: Four times a day (QID) | ORAL | 0 refills | Status: AC | PRN
  Filled 2022-10-22: qty 15, 4d supply, fill #0

## 2022-10-22 MED ORDER — OXYCODONE-ACETAMINOPHEN 5-325 MG PO TABS
1.0000 | ORAL_TABLET | Freq: Once | ORAL | Status: AC
  Administered 2022-10-22: 1 via ORAL
  Filled 2022-10-22: qty 1

## 2022-10-22 MED ORDER — BACLOFEN 10 MG PO TABS
10.0000 mg | ORAL_TABLET | Freq: Three times a day (TID) | ORAL | 0 refills | Status: AC
  Filled 2022-10-22: qty 21, 7d supply, fill #0

## 2022-10-22 NOTE — Discharge Instructions (Signed)
Take Tylenol and ibuprofen as needed for pain.  Tramadol for pain not controlled by these medications.  Baclofen for the muscle aches.  Apply ice to all areas that hurt.  Return emergency department worsening.  Follow-up with orthopedics for the leg weakness.

## 2022-10-22 NOTE — ED Notes (Signed)
Signature pad unavailable. Pt verbalized understanding of discharge instructions.

## 2022-10-22 NOTE — ED Triage Notes (Signed)
Pt presents to the ED via POV due to a fall that happened last night. Pt states he has a hx of bilateral hip replacement and sometimes his legs give out on him. Pt states yesterday he was walking up the steps and when he got to the top of the flight he fell backwards down appox. 13 stairs. Pt states he hit everything. Pt denies blood thinners and LOC. Pt currently in wheelchair but walked to and from the car. Pt A&Ox4

## 2022-10-22 NOTE — ED Provider Notes (Signed)
[facility] Provider Note    Event Date/Time   First MD Initiated Contact with Patient 10/22/22 1240     (approximate)   History   Fall   HPI  Brian Mcconnell is a 52 y.o. male with history of colon cancer, bilateral hip surgeries presents emergency department complaining of bilateral hip pain, lower back pain, neck pain and head injury from a fall down 13 steps yesterday.  No LOC.  States he did have a headache last night.  States his legs feel weak.  No vomiting or abdominal pain.      Physical Exam   Triage Vital Signs: ED Triage Vitals  Enc Vitals Group     BP 10/22/22 1139 112/84     Pulse Rate 10/22/22 1139 76     Resp 10/22/22 1139 18     Temp 10/22/22 1139 98.1 F (36.7 C)     Temp Source 10/22/22 1139 Oral     SpO2 10/22/22 1139 95 %     Weight 10/22/22 1137 133 lb 13.1 oz (60.7 kg)     Height 10/22/22 1137 '5\' 2"'$  (1.575 m)     Head Circumference --      Peak Flow --      Pain Score 10/22/22 1136 8     Pain Loc --      Pain Edu? --      Excl. in [place]? --     Most recent vital signs: Vitals:   10/22/22 1139  BP: 112/84  Pulse: 76  Resp: 18  Temp: 98.1 F (36.7 C)  SpO2: 95%     General: Awake, no distress.   CV:  Good peripheral perfusion. regular rate and  rhythm Resp:  Normal effort. Lungs cta Abd:  No distention.   Other:  C-spine tender, lumbar spine tender, bilateral hips tender, neurovascular is intact   ED Results / Procedures / Treatments   Labs (all labs ordered are listed, but only abnormal results are displayed) Labs Reviewed - No data to display   EKG     RADIOLOGY X-ray of the right and left hip, CT of the head, C-spine, lumbar spine    PROCEDURES:   Procedures   MEDICATIONS ORDERED IN ED: Medications  oxyCODONE-acetaminophen (PERCOCET/ROXICET) 5-325 MG per tablet 1 tablet (1 tablet Oral Given 10/22/22 1310)     IMPRESSION / MDM / ASSESSMENT AND PLAN / ED COURSE  I reviewed the triage  vital signs and the nursing notes.                              Differential diagnosis includes, but is not limited to, contusion, fracture, strain, subdural, SAH  Patient's presentation is most consistent with acute complicated illness / injury requiring diagnostic workup.   CT of the head, C-spine and lumbar spine, x-ray right and left hip  I did review the radiology report and x-rays, interpreted as being negative for any acute abnormality  Patient was given Percocet 5/325 mg 1 p.o. for pain   CT of the head, C-spine and lumbar spine independently reviewed and interpreted by me as being negative for any acute abnormality.  Confirmed by radiology  I did explain the findings to the patient.  He was given a prescription for baclofen and tramadol.  He is to follow-up with his regular doctor as needed.  Follow-up with orthopedics for the chronic leg weakness.  Patient is in agreement treatment plan.  He was discharged stable condition.   FINAL CLINICAL IMPRESSION(S) / ED DIAGNOSES   Final diagnoses:  Fall, initial encounter  Minor head injury, initial encounter  Multiple contusions     Rx / DC Orders   ED Discharge Orders          Ordered    traMADol (ULTRAM) 50 MG tablet  Every 6 hours PRN        10/22/22 1420    baclofen (LIORESAL) 10 MG tablet  3 times daily        10/22/22 1420             Note:  This document was prepared using Dragon voice recognition software and may include unintentional dictation errors.    Versie Starks, PA-C 10/22/22 1422    Nance Pear, MD 10/22/22 [phone]

## 2022-10-25 ENCOUNTER — Other Ambulatory Visit: Payer: Self-pay

## 2022-10-25 MED ORDER — METHYLPREDNISOLONE 4 MG PO TBPK
ORAL_TABLET | ORAL | 0 refills | Status: AC
  Filled 2022-10-25: qty 21, 6d supply, fill #0

## 2022-10-25 MED ORDER — CELECOXIB 200 MG PO CAPS
200.0000 mg | ORAL_CAPSULE | Freq: Two times a day (BID) | ORAL | 0 refills | Status: AC
  Filled 2022-10-25: qty 60, 30d supply, fill #0

## 2022-11-02 NOTE — Telephone Encounter (Signed)
error 

## 2022-11-20 ENCOUNTER — Other Ambulatory Visit: Payer: Self-pay | Admitting: Student

## 2022-11-20 DIAGNOSIS — M5416 Radiculopathy, lumbar region: Secondary | ICD-10-CM

## 2022-11-20 DIAGNOSIS — R29898 Other symptoms and signs involving the musculoskeletal system: Secondary | ICD-10-CM

## 2022-11-20 DIAGNOSIS — M4807 Spinal stenosis, lumbosacral region: Secondary | ICD-10-CM

## 2022-11-20 DIAGNOSIS — G8929 Other chronic pain: Secondary | ICD-10-CM

## 2022-11-20 DIAGNOSIS — G6289 Other specified polyneuropathies: Secondary | ICD-10-CM

## 2022-11-22 ENCOUNTER — Encounter: Payer: Self-pay | Admitting: Gastroenterology

## 2022-11-22 ENCOUNTER — Ambulatory Visit (INDEPENDENT_AMBULATORY_CARE_PROVIDER_SITE_OTHER): Payer: Medicaid Other | Admitting: Gastroenterology

## 2022-11-22 VITALS — BP 113/76 | HR 86 | Temp 98.3°F | Wt 136.6 lb

## 2022-11-22 DIAGNOSIS — R1084 Generalized abdominal pain: Secondary | ICD-10-CM

## 2022-11-22 MED ORDER — FAMOTIDINE 40 MG PO TABS: 40.0000 mg | ORAL_TABLET | Freq: Every day | ORAL | 2 refills | Status: AC

## 2022-11-22 NOTE — Patient Instructions (Signed)
Please start taking Pepcid 1 tablet at bedtime every day for 3 months. Then you could purchase over-the-counter.  Please take IBGard daily as needed. This could be purchased at your local pharmacy without a prescription.  Follow up as needed.

## 2022-11-22 NOTE — Progress Notes (Signed)
Brian Bellows MD, MRCP(U.K) [address]  [place]  [place], [place] [address]  Main: [phone]  Fax: [phone]   Primary Care Physician: [facility]  Primary Gastroenterologist:  Dr. Jonathon Mcconnell   Chief Complaint  Patient presents with   Abdominal Pain    HPI: Brian Mcconnell is a 52 y.o. male   Summary of history : Patient had evidence November 2023 for abdominal pain.  He had been seen in the emergency room in October when he presented with abdominal pain with a background of excess alcohol consumption that he had to stop.  A CT scan of the abdomen showed no abnormalities except fatty liver.  He was asked to subsequently follow-up with GI.The abdominal pain had been ongoing for a few months not improved ,present throughout the day sharp in nature points to his umbilicus no clear aggravating or relieving factors except a hot shower. Used a pack of Advil a day for a few months, consumed alcohol on a daily basis for many years at least 3 beers a day. Exposure to marijuana. No unintentional weight loss no prior colonoscopy no prior endoscopy not on a PPI.    Interval history   08/21/2022-11/22/2022  08/21/2022: H. pylori breath test normal. 10/05/2022: EGD: Multiple erosions seen in the greater curvature the stomach biopsies taken on the same day a colonoscopy was performed 6 polyps were.  Found in the rectum descending colon and ascending colon which were resected the polyps were hyperplastic and the gastric biopsies showed minimal chronic inflammation negative for H. pylori.  10/25/2022 B12 low, folate normal, CMP normal, hemoglobin 15.2   Since his next visit he continues to smoke marijuana, drink alcohol and use Motrin.  He states he took Lasix but it made his head hurt and hence he stopped taking it.  Denies any constipation.  Current Outpatient Medications  Medication Sig Dispense Refill   albuterol (VENTOLIN HFA) 108 (90 Base) MCG/ACT inhaler Inhale 2  puffs into the lungs every 6 (six) hours as needed for wheezing or shortness of breath. 8 g 2   celecoxib (CELEBREX) 200 MG capsule Take 1 capsule (200 mg total) by mouth 2 (two) times daily. 60 capsule 0   famotidine (PEPCID) 20 MG tablet Take 1 tablet (20 mg total) by mouth daily. 30 tablet 0   GI Cocktail (alum & mag hydroxide-simethicone/lidocaine)oral mixture Take 15 mLs by mouth 3 (three) times daily as needed (Abd pain). 180 mL 1   methylPREDNISolone (MEDROL DOSEPAK) 4 MG TBPK tablet Follow package directions. 21 tablet 0   omeprazole (PRILOSEC) 40 MG capsule Take 1 capsule (40 mg total) by mouth in the morning and at bedtime. 180 capsule 0   polyethylene glycol-electrolytes (NULYTELY) 420 g solution Prepare according to package instructions. Starting at 5:00 PM: Drink one 8 oz glass of mixture every 15 minutes until you finish half of the jug. Five hours prior to procedure, drink 8 oz glass of mixture every 15 minutes until it is all gone. Make sure you do not drink anything 4 hours prior to your procedure. 4000 mL 0   traMADol (ULTRAM) 50 MG tablet Take 1 tablet (50 mg total) by mouth every 6 (six) hours as needed. 15 tablet 0   dicyclomine (BENTYL) 10 MG capsule Take 1 capsule (10 mg total) by mouth 3 (three) times daily as needed (abdominal cramping). (Patient not taking: Reported on 08/21/2022) 90 capsule 0   No current facility-administered medications for this visit.  Allergies as of 11/22/2022 - Review Complete 11/22/2022  Allergen Reaction Noted   Bee venom Hives and Swelling 10/19/2017   Famotidine  10/25/2022    ROS:  General: Negative for anorexia, weight loss, fever, chills, fatigue, weakness. ENT: Negative for hoarseness, difficulty swallowing , nasal congestion. CV: Negative for chest pain, angina, palpitations, dyspnea on exertion, peripheral edema.  Respiratory: Negative for dyspnea at rest, dyspnea on exertion, cough, sputum, wheezing.  GI: See history of present  illness. GU:  Negative for dysuria, hematuria, urinary incontinence, urinary frequency, nocturnal urination.  Endo: Negative for unusual weight change.    Physical Examination:   BP 113/76   Pulse 86   Temp 98.3 F (36.8 C) (Oral)   Wt 136 lb 9.6 oz (62 kg)   BMI 24.98 kg/m   General: Well-nourished, well-developed in no acute distress.  Eyes: No icterus. Conjunctivae pink. Abdomen: Bowel sounds are normal, nontender, nondistended, no hepatosplenomegaly or masses, no abdominal bruits or hernia , no rebound or guarding.   Extremities: No lower extremity edema. No clubbing or deformities. Neuro: Alert and oriented x 3.  Grossly intact. Skin: Warm and dry, no jaundice.   Psych: Alert and cooperative, normal mood and affect.   Imaging Studies: No results found.  Assessment and Plan:   Brian Mcconnell is a 52 y.o. y/o male  here to for periumbilical pain ongoing for a few months.  CT scan showed no abnormality except fatty liver he has a history of excess alcohol consumption, excess use of NSAIDs, excess use of marijuana.  Since his last visit he has not stopped using marijuana or drinking alcohol or using Motrin.    Plan 1.Stop all alcohol , nsaid and marijuana use reiterated the importance since he could not tolerate PPI 2.  Commence on Pepcid 40 mg once a day, trial of IBgard   [name]  MD,MRCP [facility]) Follow up in as needed

## 2022-11-23 ENCOUNTER — Encounter: Payer: Self-pay | Admitting: Gastroenterology

## 2022-11-25 ENCOUNTER — Ambulatory Visit
Admission: RE | Admit: 2022-11-25 | Discharge: 2022-11-25 | Disposition: A | Discharge status: Home / Self Care | Payer: Medicaid Other | Source: Ambulatory Visit | Attending: Student | Admitting: Student

## 2022-11-25 DIAGNOSIS — R29898 Other symptoms and signs involving the musculoskeletal system: Secondary | ICD-10-CM | POA: Diagnosis present

## 2022-11-25 DIAGNOSIS — G6289 Other specified polyneuropathies: Secondary | ICD-10-CM

## 2022-11-25 DIAGNOSIS — M4807 Spinal stenosis, lumbosacral region: Secondary | ICD-10-CM

## 2022-11-25 DIAGNOSIS — M5442 Lumbago with sciatica, left side: Secondary | ICD-10-CM | POA: Diagnosis present

## 2022-11-25 DIAGNOSIS — G8929 Other chronic pain: Secondary | ICD-10-CM | POA: Diagnosis present

## 2022-11-25 DIAGNOSIS — M5416 Radiculopathy, lumbar region: Secondary | ICD-10-CM | POA: Diagnosis present

## 2022-11-25 DIAGNOSIS — M5441 Lumbago with sciatica, right side: Secondary | ICD-10-CM | POA: Diagnosis present
